# Patient Record
Sex: Female | Born: 1965 | Race: Black or African American | Hispanic: No | Marital: Married | Smoking: Never smoker
Health system: Southern US, Community
[De-identification: ages and names within clinical notes are randomized; demographics above are authoritative.]

## PROBLEM LIST (undated history)

## (undated) ENCOUNTER — Emergency Department (HOSPITAL_COMMUNITY): Discharge status: Absent without leave | Payer: BC Managed Care – PPO

## (undated) DIAGNOSIS — J45909 Unspecified asthma, uncomplicated: Secondary | ICD-10-CM

## (undated) DIAGNOSIS — G971 Other reaction to spinal and lumbar puncture: Secondary | ICD-10-CM

## (undated) DIAGNOSIS — G43909 Migraine, unspecified, not intractable, without status migrainosus: Secondary | ICD-10-CM

## (undated) DIAGNOSIS — S82899A Other fracture of unspecified lower leg, initial encounter for closed fracture: Secondary | ICD-10-CM

## (undated) HISTORY — PX: ABDOMINAL HYSTERECTOMY: SHX81

## (undated) HISTORY — PX: MYOMECTOMY: SHX85

## (undated) HISTORY — PX: ANKLE FRACTURE SURGERY: SHX122

---

## 2003-01-17 ENCOUNTER — Inpatient Hospital Stay (HOSPITAL_COMMUNITY): Admission: AD | Admit: 2003-01-17 | Discharge: 2003-01-17 | Payer: Self-pay | Admitting: Obstetrics and Gynecology

## 2003-03-01 ENCOUNTER — Encounter: Payer: Self-pay | Admitting: Obstetrics and Gynecology

## 2003-03-01 ENCOUNTER — Ambulatory Visit (HOSPITAL_COMMUNITY): Admission: RE | Admit: 2003-03-01 | Discharge: 2003-03-01 | Payer: Self-pay | Admitting: Obstetrics and Gynecology

## 2003-03-19 ENCOUNTER — Encounter: Payer: Self-pay | Admitting: Obstetrics and Gynecology

## 2003-03-19 ENCOUNTER — Observation Stay (HOSPITAL_COMMUNITY): Admission: AD | Admit: 2003-03-19 | Discharge: 2003-03-20 | Payer: Self-pay | Admitting: Obstetrics and Gynecology

## 2003-03-26 ENCOUNTER — Inpatient Hospital Stay (HOSPITAL_COMMUNITY): Admission: AD | Admit: 2003-03-26 | Discharge: 2003-03-27 | Payer: Self-pay | Admitting: Obstetrics and Gynecology

## 2003-03-28 ENCOUNTER — Inpatient Hospital Stay (HOSPITAL_COMMUNITY): Admission: AD | Admit: 2003-03-28 | Discharge: 2003-04-01 | Payer: Self-pay | Admitting: Obstetrics and Gynecology

## 2003-03-29 ENCOUNTER — Encounter: Payer: Self-pay | Admitting: Obstetrics and Gynecology

## 2003-03-29 ENCOUNTER — Encounter (INDEPENDENT_AMBULATORY_CARE_PROVIDER_SITE_OTHER): Payer: Self-pay | Admitting: Specialist

## 2003-04-02 ENCOUNTER — Encounter: Admission: RE | Admit: 2003-04-02 | Discharge: 2003-05-02 | Payer: Self-pay | Admitting: Obstetrics and Gynecology

## 2004-11-26 ENCOUNTER — Inpatient Hospital Stay (HOSPITAL_COMMUNITY): Admission: AD | Admit: 2004-11-26 | Discharge: 2004-11-27 | Payer: Self-pay | Admitting: Obstetrics and Gynecology

## 2004-12-19 ENCOUNTER — Other Ambulatory Visit: Admission: RE | Admit: 2004-12-19 | Discharge: 2004-12-19 | Payer: Self-pay | Admitting: Obstetrics and Gynecology

## 2005-01-24 ENCOUNTER — Encounter: Admission: RE | Admit: 2005-01-24 | Discharge: 2005-01-24 | Payer: Self-pay | Admitting: Obstetrics and Gynecology

## 2005-12-23 ENCOUNTER — Other Ambulatory Visit: Admission: RE | Admit: 2005-12-23 | Discharge: 2005-12-23 | Payer: Self-pay | Admitting: Obstetrics and Gynecology

## 2007-06-11 ENCOUNTER — Encounter: Admission: RE | Admit: 2007-06-11 | Discharge: 2007-06-11 | Payer: Self-pay | Admitting: Specialist

## 2008-03-22 ENCOUNTER — Encounter: Admission: RE | Admit: 2008-03-22 | Discharge: 2008-03-22 | Payer: Self-pay | Admitting: Obstetrics and Gynecology

## 2009-05-09 ENCOUNTER — Encounter: Admission: RE | Admit: 2009-05-09 | Discharge: 2009-05-09 | Payer: Self-pay | Admitting: Obstetrics and Gynecology

## 2010-05-10 ENCOUNTER — Encounter: Admission: RE | Admit: 2010-05-10 | Discharge: 2010-05-10 | Payer: Self-pay | Admitting: Internal Medicine

## 2010-08-21 ENCOUNTER — Emergency Department (HOSPITAL_COMMUNITY): Admission: EM | Admit: 2010-08-21 | Discharge: 2010-08-21 | Payer: Self-pay | Admitting: Family Medicine

## 2011-05-16 NOTE — Op Note (Signed)
[name], [name] NO.:  [id number]   MEDICAL RECORD NO.:  [id number]                   PATIENT TYPE:  INP   LOCATION:  9142                                 FACILITY:  [facility]   PHYSICIAN:  Janine Limbo, M.D.            DATE OF BIRTH:  July 07, 1966   DATE OF PROCEDURE:  03/29/2003  DATE OF DISCHARGE:                                 OPERATIVE REPORT   PREOPERATIVE DIAGNOSES:  1. Gestation at 35 weeks.  2. Third trimester bleeding.  3. Low lying placenta.   POSTOPERATIVE DIAGNOSES:  1. Gestation at 35 weeks.  2. Third trimester bleeding.  3. Low lying placenta.   PROCEDURE:  Amniocentesis, low maturity studies.   SURGEON:  Janine Limbo, M.D.   ANESTHESIA:  1% Xylocaine.   INDICATIONS:  The patient is a 45 year old female, gravida 4, para 1-0-2-1  who presents at 23 weeks' gestation with third trimester bleeding that is  significant and a known low lying placenta.  An amniocentesis was attempted  on the antenatal unit, but we were unable to obtain fluid.  We presented  then to the radiology suite for an attempt at amniocentesis under radiologic  guidance by the radiology team.  The patient understands the indications of  her procedure, and she accepts the risks.   FINDINGS:  Again, the patient was noted to have a single intrauterine  gestation and cephalic presentation.  The placenta was anterior and low  lying.  The amniotic fluid volume was normal.  Again, the patient's blood  type is B positive.  At this time, we were able to obtain 5 mL of clear  fluid.   DESCRIPTION OF PROCEDURE:  The patient was taken to the radiology suite  where again an ultrasound was performed.  An appropriate fluid pocket was  isolated.  The patient's abdomen was prepped with Betadine and then  sterilely draped.  Local Xylocaine was used for anesthetic.  The spinal  needle was inserted, and then under the ultrasound guidance from the  radiology team, we  tried to guide the needle into the pocket.  Initially, we  were unsuccessful.  A second site was identified, and again the area was  numbed using 1% Xylocaine.  The spinal needle was inserted, and this time we  were able to obtain 5 mL of clear fluid.  The fluid was sent to [facility] for a LS ratio and for a PG determination.  The patient  tolerated her procedure well.  Repeat ultrasound shows a normal-appearing  infant.  The patient was taken back to the antenatal unit where her  nonstress was noted to be reactive.  Janine Limbo, M.D.   AVS/MEDQ  D:  03/29/2003  T:  03/29/2003  Job:  [id number]
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
## 2011-05-16 NOTE — Op Note (Signed)
[name], HOSKIE NO.:  [id number]   MEDICAL RECORD NO.:  [id number]                   PATIENT TYPE:  INP   LOCATION:  9142                                 [facility]   PHYSICIAN:  Janine Limbo, M.D.            DATE OF BIRTH:  1966/12/13   DATE OF PROCEDURE:  03/29/2003  DATE OF DISCHARGE:                                 OPERATIVE REPORT   PREOPERATIVE DIAGNOSES:  1. Thirty-five weeks' gestation.  2. Third trimester bleeding.  3. Low lying placenta.   POSTOPERATIVE DIAGNOSES:  1. Thirty-five weeks' gestation.  2. Third trimester bleeding.  3. Low lying placenta.   PROCEDURE:  Amniocentesis for maturity studies - unsuccessful.   OBSTETRICIAN:  Janine Limbo, M.D.   ANESTHESIA:  Local 1% Xylocaine.   DISPOSITION:  The patient is a 45 year old female, gravida 4, para 1-0-2-1,  who presents at 62 weeks' gestation.  She has a known low lying placenta and  she was admitted on March 28, 2003, because of recurrent episodes of third  trimester bleeding.  Her bleeding has been significant.  A speculum exam was  performed and she was noted to have a positive Nitrazine test that was  believed to be due to blood.  Her fern test was negative.  Because of the  patient's significant bleeding, we discussed the merit of amniocentesis.  The risks and benefits were reviewed.  The patient elected to proceed.   FINDINGS:  An ultrasound was performed by Janine Limbo, M.D. in the  antenatal area and the patient was found to have a single intrauterine  gestation.  The infant was in a cephalic presentation.  The placenta was  anterior and grade 2-3.  The placenta was indeed low lying.  The amniotic  fluid volume appeared normal.  The fetal heart motions appeared to be  normal.  The patient's blood type is noted to be B-positive.  Several  attempts were made to obtain fluid but we were unable to do so.   PROCEDURE:  The patient was placed  in a supine position.  An ultrasound was  performed by Janine Limbo, M.D.  An appropriate fluid pocket was  isolated.  The patient's abdomen was prepped with multiple layers of  Betadine and then sterilely draped.  Xylocaine 1% was injected into the  skin.  The spinal needle was inserted into the amniotic cavity.  Initially a  small amount of fluid (less than 1 mL) was obtained and then no other fluid  could be obtained.  We tried to position the needle in the small area but no  fluid again could be obtained.  The ultrasound was repeated and a second  attempt was made in a second place to obtain fluid.  Once again, we were  unsuccessful.  We tried to guide the fluid into the amniotic cavity using  the ultrasound on the  antenatal area but we were unsuccessful.  The decision was made to proceed with repeat amniocentesis in the radiology  area.  The needle was removed.  Repeat ultrasound was performed and there  was no apparent harm to the gestation.  The patient was placed on monitor  and her nonstress test was reactive.  The estimated blood loss for the  procedure was 2 mL.                                               Janine Limbo, M.D.    AVS/MEDQ  D:  03/29/2003  T:  03/29/2003  Job:  [id number]

## 2011-05-16 NOTE — Op Note (Signed)
[name], [name] NO.:  [id number]   MEDICAL RECORD NO.:  [id number]                   PATIENT TYPE:  INP   LOCATION:  9142                                 [facility]   PHYSICIAN:  Janine Limbo, M.D.            DATE OF BIRTH:  April 15, 1966   DATE OF PROCEDURE:  03/29/2003  DATE OF DISCHARGE:                                 OPERATIVE REPORT   PREOPERATIVE DIAGNOSES:  1. Thirty-five weeks' gestation.  2. Prior myomectomy.  3. Low-lying placenta.  4. Fibroid uterus.  5. Active labor.   POSTOPERATIVE DIAGNOSES:  1. Thirty-five weeks' gestation.  2. Prior myomectomy.  3. Low-lying placenta.  4. Fibroid uterus.  5. Active labor.  6. Placental abruption.   PROCEDURE:  Primary low transverse cesarean section.   SURGEON:  Janine Limbo, M.D.   ASSISTANT:  Rica Koyanagi, C.N.M.   ANESTHESIA:  Spinal.   DISPOSITION:  The patient is a 45 year old female, gravida 4, para 1-0-2-1,  who has a known low-lying placenta and who was admitted on 03/28/03 with  third trimester bleeding.  An amniocentesis was performed today to document  fetal maturity.  The test results returned immature.  The patient began to  have hard, frequent contractions.  Speculum exam showed that the cervix had  changed from being long and closed to then being 4 cm dilated.  The patient  was in active labor.  The patient has had a prior myomectomy, and she has  been told to have a cesarean section for the delivery of her infant.  We  discussed the risks associated with cesarean section, which included, but  not limited to, anesthetic complications, bleeding, infection, and possible  damage to surrounding organs.   FINDINGS:  A 5 pound 2 ounce female infant [name]) was delivered from the  cephalic presentation.  The Apgars were 8 at one minute and 9 at five  minutes.  The fallopian tubes and ovaries were normal except for adhesions  between the right ovary and  the right posterior uterus.  The patient was  noted to have an anterior placenta.  There was also an abruption at the  lower placental segment.  The patient was noted to have a 2 cm fibroid on  the left posterior fundus of the uterus.  She had a 4-5 cm fibroid on the  right posterior fundus.   DESCRIPTION OF PROCEDURE:  The patient was taken to the operating room,  where a spinal anesthetic was given.  The patient's abdomen, perineum, and  outer vagina were prepped with multiple layers of Betadine.  A Foley  catheter was placed in the bladder.  The patient was sterilely draped.  A  low transverse incision was made in the abdomen and carried sharply through  the subcutaneous tissue, the fascia, and the anterior peritoneum.  An  incision was made on the lower uterine segment  and extended transversely.  Old blood clot was noted.  The placenta was incised so that we could get to  the infant.  The fetal head was delivered without difficulty.  The mouth and  nose were suctioned.  The remainder of the infant was then delivered.  The  cord was clamped and cut and the infant was handed to the awaiting pediatric  team.  Routine cord blood studies were obtained.  The placenta was removed.  The uterine cavity was then cleaned of amniotic fluid, clotted blood, and  membranes.  A banjo curette was used to curette the inner lining of the  uterus.  The uterine incision was closed using a running locking suture of 2-  0 Vicryl, followed by an imbricating suture of 2-0 Vicryl.  Figure-of-eight  sutures of 2-0 Vicryl were used for hemostasis.  Bleeding was brisk from the  lower uterine segment.  The pericolonic gutters were cleaned of amniotic  fluid and clotted blood.  The pelvis was vigorously irrigated.  Hemostasis  was confirmed.  The anterior peritoneum and the abdominal musculature were  reapproximated in the midline using 2-0 Vicryl.  The fascia and the  subcutaneous layer were irrigated.  Hemostasis  was adequate.  The fascia was  closed using a running suture of 0 Vicryl, followed by three interrupted  sutures of 0 Vicryl.  A Jackson-Pratt drain was placed in the subcutaneous  space and brought out through the left lower quadrant.  The drain was  sutured in place using 3-0 silk.  The subcutaneous layer was closed using  interrupted sutures of 2-0 Vicryl.  The skin was reapproximated using skin  staples.  The sponge, needle, and instrument counts were correct on two  occasions.  The estimated blood loss was 1200 mL.  The patient tolerated her  procedure well.  The patient was taken to the recovery room in stable  condition.  The infant was taken to the full-term nursery in stable  condition.                                               Janine Limbo, M.D.    AVS/MEDQ  D:  03/29/2003  T:  03/30/2003  Job:  [id number]
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
## 2011-05-16 NOTE — Discharge Summary (Signed)
NAMECHENAY, [name] NO.:  [id number]   MEDICAL RECORD NO.:  [id number]                   PATIENT TYPE:  INP   LOCATION:  9142                                 [facility]ILITY:  [facility]   PHYSICIAN:  [name], C.N.M.        DATE OF BIRTH:  26-Aug-1966   DATE OF ADMISSION:  03/28/2003  DATE OF DISCHARGE:  04/01/2003                                 DISCHARGE SUMMARY   ADMISSION DIAGNOSES:  1. Intrauterine pregnancy at [redacted] weeks gestation.  2. Third trimester bleeding.  3. Low lying placenta.   DISCHARGE DIAGNOSES:  1. Intrauterine pregnancy at [redacted] weeks gestation.  2. Third trimester bleeding.  3. Low lying placenta.  4. Early labor.  5. History of severe shoulder dystocia.  6. Desiring a primary cesarean section for delivery with this pregnancy.  7. Immature fetal lung studies by amniocentesis.  8. Placental abruption.  9. Status post myomectomy.  10.      Breast feeding.  11.      Undecided regarding contraception.   PROCEDURE:  1. Amniocentesis for fetal lung maturity studies.  2. Primary low transverse cesarean section for delivery of a viable female     infant named [name] who weighed 5 pounds, 2 ounces and had Apgar's of 8     and 9 on 03/29/03, attended by Dr. Kirkland [name] and [name],     C.N.M.   HOSPITAL COURSE:  [name] is a 45 year old black female gravida 4, para 1,  0, 2, 1 at 34-6/7 weeks who presented complaining of a large amount of  bright red bleeding and clot.  She was admitted initially for observation,  however, continued to bleed and was recommended to proceed with an  amniocentesis for fetal lung maturity studies to decide regarding delivery.  Her fetal lung maturity studies by amniocentesis were negative and tocolysis  was recommended to await further fetal lung maturity.  However she continued to contract and was found to be 4 cm dilated at 6:20  p.m. on 3/31 and was then recommended to proceed with  cesarean section for  delivery secondary to history of severe shoulder dystocia and history of  prior myomectomy and low lying placenta with bleeding.  She underwent the  same and was delivered of a viable female infant named [name] who weighed 5  pounds, 2 ounces and had Apgar's of 8 and 9 on 03/29/03, attended by Dr.  Kirkland [name] and [name], C.N.M.  She was noted to have  placental abruption upon delivery.  Please see operative note for further  details.  Her postoperative course has been essentially uneventful.  She is  ambulating, voiding, and eating without difficulty.  She did have a  significant headache on postoperative day number two but that has resolved  after some Fioricet and Motrin, and she is tolerating regular activities,  and breast feeding is going well.  She is deemed ready  for discharge today.   DISCHARGE INSTRUCTIONS:  As per the [facility] OB/GYN hand-out.   DISCHARGE MEDICATIONS:  Motrin 600 mg p.o. q.6h p.r.n. for pain, Tylox 1-2  p.o. q.4-6h p.r.n. for pain, Feosol daily, prenatal vitamins daily.   DISCHARGE LABS:  Her hemoglobin is 7.8, WBC count at 11,700 and her  platelets at 281,000.  Her discharge follow up will be in six weeks at [facility] or  p.r.n.                                               [name], C.N.M.    SJD/MEDQ  D:  04/01/2003  T:  04/02/2003  Job:  [id number]
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
## 2011-05-16 NOTE — Discharge Summary (Signed)
   [name], [name] NO.:  [id number]   MEDICAL RECORD NO.:  [id number]                   PATIENT TYPE:  INP   LOCATION:  9149                                 [facility]ILITY[facility]   PHYSICIAN:  Hal Morales, M.D.             DATE OF BIRTH:  23-Jan-1966   DATE OF ADMISSION:  03/19/2003  DATE OF DISCHARGE:  03/20/2003                                 DISCHARGE SUMMARY   ADMISSION DIAGNOSES:  1. Intrauterine pregnancy at 12 and 4/7 weeks.  2. Low lying placenta.  3. Vaginal bleeding.   DISCHARGE DIAGNOSES:  1. Intrauterine pregnancy at 12 and 4/7 weeks.  2. Low lying placenta.  3. Vaginal bleeding.  4. Resolved preterm uterine contractions.  5. Resolved third trimester bleeding.   PROCEDURE:  None.   HOSPITAL COURSE:  The patient is a 45 year old gravida 4, para 1-0-2-1 at 29  and 4/7 weeks who presented complaining of an episode of bright red  bleeding.  No contractions or cramping noted at that time.  She was noted to  have a large clot of blood in the vagina that was evacuated without further  active bleeding and was admitted for further observation.  She had an  ultrasound that showed a low lying placenta 1.0 cm from internal os.  Had  been previously 9 mm from internal os on March 3.  Her AFI was 10.2.  Estimated fetal weight was 25th-50th percentile.  Cervix was 3.1 cm long and  the fetus was cephalic in presentation.  She has been observed for the last  24 hours without further bleeding, without regular uterine contractions, and  is deemed okay for discharge today continuing with pelvic rest and out of  work for one week.  She has a follow-up appointment on March 24, 2003 at  [facility].   DISCHARGE INSTRUCTIONS:  She is to call for any reoccurrence of bleeding,  cramping, or other problems.  She is to follow up on March 24, 2003 at  [facility] OB[facility] or p.r.n.  She is to be out of work for one week and  continue on  pelvic rest throughout the rest of this pregnancy.   DISCHARGE LABORATORIES:  Her hemoglobin is 11.0.  Her hematocrit is 33.1.  WBC count is 7.1.  Her platelets are 287,000.     Concha Pyo. Duplantis, C.N.M.              Hal Morales, M.D.    SJD/MEDQ  D:  03/20/2003  T:  03/20/2003  Job:  [id number]

## 2012-03-16 ENCOUNTER — Ambulatory Visit (INDEPENDENT_AMBULATORY_CARE_PROVIDER_SITE_OTHER): Payer: BC Managed Care – PPO | Admitting: Obstetrics and Gynecology

## 2012-03-16 ENCOUNTER — Ambulatory Visit: Payer: Self-pay | Admitting: Obstetrics and Gynecology

## 2012-03-16 DIAGNOSIS — N920 Excessive and frequent menstruation with regular cycle: Secondary | ICD-10-CM

## 2012-03-16 DIAGNOSIS — Z01419 Encounter for gynecological examination (general) (routine) without abnormal findings: Secondary | ICD-10-CM

## 2012-04-13 ENCOUNTER — Ambulatory Visit (INDEPENDENT_AMBULATORY_CARE_PROVIDER_SITE_OTHER): Payer: BC Managed Care – PPO | Admitting: Obstetrics and Gynecology

## 2012-04-13 ENCOUNTER — Encounter: Payer: Self-pay | Admitting: Obstetrics and Gynecology

## 2012-04-13 VITALS — BP 122/60 | HR 79 | Wt 209.0 lb

## 2012-04-13 DIAGNOSIS — N920 Excessive and frequent menstruation with regular cycle: Secondary | ICD-10-CM

## 2012-04-13 DIAGNOSIS — G43909 Migraine, unspecified, not intractable, without status migrainosus: Secondary | ICD-10-CM | POA: Insufficient documentation

## 2012-04-13 DIAGNOSIS — N923 Ovulation bleeding: Secondary | ICD-10-CM

## 2012-04-13 DIAGNOSIS — N921 Excessive and frequent menstruation with irregular cycle: Secondary | ICD-10-CM

## 2012-04-13 NOTE — Patient Instructions (Signed)
Endometrial Biopsy This is a test in which a tissue sample (a biopsy) is taken from inside the uterus (womb). It is then looked at by a specialist under a microscope to see if the tissue is normal or abnormal. The endometrium is the lining of the uterus. This test helps determine where you are in your menstrual cycle and how hormone levels are affecting the lining of the uterus. Another use for this test is to diagnose endometrial cancer, tuberculosis, polyps, or inflammatory conditions and to evaluate uterine bleeding. PREPARATION FOR TEST No preparation or fasting is necessary. NORMAL FINDINGS No pathologic conditions. Presence of "secretory-type" endometrium 3 to 5 days before to normal menstruation. Ranges for normal findings may vary among different laboratories and hospitals. You should always check with your doctor after having lab work or other tests done to discuss the meaning of your test results and whether your values are considered within normal limits. MEANING OF TEST  Your caregiver will go over the test results with you and discuss the importance and meaning of your results, as well as treatment options and the need for additional tests if necessary. OBTAINING THE TEST RESULTS It is your responsibility to obtain your test results. Ask the lab or department performing the test when and how you will get your results. Document Released: 04/17/2005 Document Revised: 12/04/2011 Document Reviewed: 11/24/2008 Exit[employer] Patient Information 2012 [employer]. 

## 2012-04-13 NOTE — Progress Notes (Signed)
ENDOMETRIAL BIOPSY   INDICATION: intermenstrual bleeding and MENORRHAGIA  LMP: 04/05/12 UPT: DECLINED     Cervix prepped with betadine Tenaculum applied to anterior lip of the cervix: no Uterus sounded at  8  cm Endometrial biopsy easily performed with Pipelle Well tolerated  Specimen appropriately identified and sent to pathology  Follow-up: Will call pt with results and for followup   Nickayla Mcinnis P MD 04/13/2012 2:48 PM

## 2012-05-03 ENCOUNTER — Telehealth: Payer: Self-pay | Admitting: Obstetrics and Gynecology

## 2012-05-03 NOTE — Telephone Encounter (Signed)
Pt given normal endometrial biopsy results and reminded of Hgb done at visit of 11.9 Options for addresssing mennorrhagia: 1) Obs only       2)Mirena       3) Endometrial Ablation Pt wants in office ablation.  Needs SHG.  She will call with the onset of her next menses to have SHG scheduled.

## 2012-06-29 ENCOUNTER — Telehealth: Payer: Self-pay | Admitting: Obstetrics and Gynecology

## 2012-06-29 NOTE — Telephone Encounter (Signed)
Chandra/vph pt °

## 2012-06-29 NOTE — Telephone Encounter (Signed)
BARBARA WILL SCHED

## 2012-07-12 ENCOUNTER — Encounter: Payer: Self-pay | Admitting: Obstetrics and Gynecology

## 2012-07-12 ENCOUNTER — Ambulatory Visit (INDEPENDENT_AMBULATORY_CARE_PROVIDER_SITE_OTHER): Payer: BC Managed Care – PPO

## 2012-07-12 ENCOUNTER — Other Ambulatory Visit: Payer: Self-pay | Admitting: Obstetrics and Gynecology

## 2012-07-12 ENCOUNTER — Ambulatory Visit (INDEPENDENT_AMBULATORY_CARE_PROVIDER_SITE_OTHER): Payer: BC Managed Care – PPO | Admitting: Obstetrics and Gynecology

## 2012-07-12 ENCOUNTER — Other Ambulatory Visit: Payer: Self-pay

## 2012-07-12 VITALS — BP 118/70 | Ht 67.0 in | Wt 205.5 lb

## 2012-07-12 DIAGNOSIS — IMO0002 Reserved for concepts with insufficient information to code with codable children: Secondary | ICD-10-CM

## 2012-07-12 DIAGNOSIS — N920 Excessive and frequent menstruation with regular cycle: Secondary | ICD-10-CM

## 2012-07-12 DIAGNOSIS — Z0000 Encounter for general adult medical examination without abnormal findings: Secondary | ICD-10-CM

## 2012-07-12 LAB — POCT URINE PREGNANCY: Preg Test, Ur: NEGATIVE

## 2012-07-12 NOTE — Addendum Note (Signed)
Addended by: Lerry Liner D on: 07/12/2012 03:53 PM   Modules accepted: Orders

## 2012-07-12 NOTE — Progress Notes (Signed)
Menorrhagia and irregular menses Last menstrual period began on June 28, 2012 and lasted for 10 days. Previous menstrual period had been in April of 2013. She denies intermenstrual bleeding.  She has significant cramping when her menses began which usually respond to ibuprofen. The ibuprofen also seems to help to a minor extent with the very heavy menses.  Sonohysterogram: Ultrasound: Anteverted uterus. C-section scar noted. Several nabothian cysts on the cervix. Normal ovaries. Normal adnexa. Saline infusion: No focal endometrial lesions are seen.  Examination: BP 118/70  Ht 5\' 7"  (1.702 m)  Wt 205 lb 8 oz (93.214 kg)  BMI 32.19 kg/m2  LMP 06/28/2012  Impression: Menorrhagia Irregular menses with history of polycystic ovarian syndrome. Normal endometrial biopsy.  Recommendation: The patient is a candidate for either a Mirena IUD, Skyler IUD, endometrial ablation, or hysterectomy.  The risks and benefits of each of these procedures is reviewed. She has decided that she would prefer to proceed with endometrial ablation. This will be done as an outpatient in the hospital

## 2012-07-14 ENCOUNTER — Encounter: Payer: BC Managed Care – PPO | Admitting: Obstetrics and Gynecology

## 2012-07-15 ENCOUNTER — Encounter: Payer: BC Managed Care – PPO | Admitting: Obstetrics and Gynecology

## 2012-07-28 ENCOUNTER — Telehealth: Payer: Self-pay | Admitting: Obstetrics and Gynecology

## 2012-07-28 NOTE — Telephone Encounter (Signed)
Endometrial Ablation; Probably Novasure scheduled for 08/24/12 @ 9:30 with [facility].  [employer] effective 06/28/12. Plan pays 70/30 after a $466 Deductible. Pre-op due  $631.41 -Adrianne Pridgen

## 2012-07-30 ENCOUNTER — Telehealth: Payer: Self-pay | Admitting: Obstetrics and Gynecology

## 2012-08-06 ENCOUNTER — Telehealth: Payer: Self-pay | Admitting: Obstetrics and Gynecology

## 2012-08-06 NOTE — Telephone Encounter (Signed)
Pt called to cancel 08/24/2012 surgery/[facility]. States deductible amt increase to $600. Wants to reschedule surgery for September. -acm

## 2012-08-24 ENCOUNTER — Encounter (HOSPITAL_COMMUNITY): Admission: RE | Payer: Self-pay | Source: Ambulatory Visit

## 2012-08-24 ENCOUNTER — Ambulatory Visit (HOSPITAL_COMMUNITY)
Admission: RE | Admit: 2012-08-24 | Payer: BC Managed Care – PPO | Source: Ambulatory Visit | Admitting: Obstetrics and Gynecology

## 2012-08-24 SURGERY — NOVASURE ABLATION
Anesthesia: Choice

## 2012-09-15 ENCOUNTER — Telehealth: Payer: Self-pay | Admitting: Obstetrics and Gynecology

## 2012-10-12 ENCOUNTER — Telehealth: Payer: Self-pay | Admitting: Obstetrics and Gynecology

## 2012-10-18 ENCOUNTER — Other Ambulatory Visit: Payer: Self-pay | Admitting: Internal Medicine

## 2012-10-18 DIAGNOSIS — Z1231 Encounter for screening mammogram for malignant neoplasm of breast: Secondary | ICD-10-CM

## 2012-11-23 ENCOUNTER — Ambulatory Visit
Admission: RE | Admit: 2012-11-23 | Discharge: 2012-11-23 | Disposition: A | Discharge status: Home / Self Care | Payer: BC Managed Care – PPO | Source: Ambulatory Visit | Attending: Internal Medicine | Admitting: Internal Medicine

## 2012-11-23 DIAGNOSIS — Z1231 Encounter for screening mammogram for malignant neoplasm of breast: Secondary | ICD-10-CM

## 2012-12-08 ENCOUNTER — Encounter: Payer: Self-pay | Admitting: Obstetrics and Gynecology

## 2012-12-08 DIAGNOSIS — R922 Inconclusive mammogram: Secondary | ICD-10-CM | POA: Insufficient documentation

## 2012-12-09 ENCOUNTER — Encounter: Payer: Self-pay | Admitting: Obstetrics and Gynecology

## 2014-02-15 ENCOUNTER — Other Ambulatory Visit: Payer: Self-pay

## 2014-02-15 DIAGNOSIS — Z1231 Encounter for screening mammogram for malignant neoplasm of breast: Secondary | ICD-10-CM

## 2014-03-02 ENCOUNTER — Ambulatory Visit
Admission: RE | Admit: 2014-03-02 | Discharge: 2014-03-02 | Disposition: A | Discharge status: Home / Self Care | Payer: Self-pay | Source: Ambulatory Visit

## 2014-03-02 DIAGNOSIS — Z1231 Encounter for screening mammogram for malignant neoplasm of breast: Secondary | ICD-10-CM

## 2015-01-05 ENCOUNTER — Other Ambulatory Visit: Payer: Self-pay | Admitting: Obstetrics and Gynecology

## 2015-03-28 ENCOUNTER — Encounter (HOSPITAL_COMMUNITY): Payer: Self-pay | Admitting: Emergency Medicine

## 2015-03-28 ENCOUNTER — Emergency Department (HOSPITAL_COMMUNITY): Payer: BC Managed Care – PPO

## 2015-03-28 ENCOUNTER — Emergency Department (HOSPITAL_COMMUNITY)
Admission: EM | Admit: 2015-03-28 | Discharge: 2015-03-28 | Disposition: A | Discharge status: Home / Self Care | Payer: BC Managed Care – PPO | Attending: Emergency Medicine | Admitting: Emergency Medicine

## 2015-03-28 DIAGNOSIS — Y998 Other external cause status: Secondary | ICD-10-CM | POA: Diagnosis not present

## 2015-03-28 DIAGNOSIS — Z79899 Other long term (current) drug therapy: Secondary | ICD-10-CM | POA: Diagnosis not present

## 2015-03-28 DIAGNOSIS — W108XXA Fall (on) (from) other stairs and steps, initial encounter: Secondary | ICD-10-CM | POA: Diagnosis not present

## 2015-03-28 DIAGNOSIS — Y9289 Other specified places as the place of occurrence of the external cause: Secondary | ICD-10-CM | POA: Insufficient documentation

## 2015-03-28 DIAGNOSIS — Y9389 Activity, other specified: Secondary | ICD-10-CM | POA: Diagnosis not present

## 2015-03-28 DIAGNOSIS — J45909 Unspecified asthma, uncomplicated: Secondary | ICD-10-CM | POA: Insufficient documentation

## 2015-03-28 DIAGNOSIS — S82852A Displaced trimalleolar fracture of left lower leg, initial encounter for closed fracture: Secondary | ICD-10-CM | POA: Diagnosis not present

## 2015-03-28 DIAGNOSIS — S9305XA Dislocation of left ankle joint, initial encounter: Secondary | ICD-10-CM | POA: Diagnosis not present

## 2015-03-28 DIAGNOSIS — G43909 Migraine, unspecified, not intractable, without status migrainosus: Secondary | ICD-10-CM | POA: Diagnosis not present

## 2015-03-28 DIAGNOSIS — S99912A Unspecified injury of left ankle, initial encounter: Secondary | ICD-10-CM | POA: Diagnosis present

## 2015-03-28 HISTORY — DX: Unspecified asthma, uncomplicated: J45.909

## 2015-03-28 HISTORY — DX: Migraine, unspecified, not intractable, without status migrainosus: G43.909

## 2015-03-28 MED ORDER — HYDROCODONE-ACETAMINOPHEN 5-325 MG PO TABS: 1.0000 | ORAL_TABLET | Freq: Four times a day (QID) | ORAL | Status: DC | PRN

## 2015-03-28 MED ORDER — HYDROMORPHONE HCL 1 MG/ML IJ SOLN
1.0000 mg | Freq: Once | INTRAMUSCULAR | Status: AC
Start: 2015-03-28 — End: 2015-03-28
  Administered 2015-03-28: 1 mg via INTRAVENOUS
  Filled 2015-03-28: qty 1

## 2015-03-28 MED ORDER — HYDROMORPHONE HCL 1 MG/ML IJ SOLN
1.0000 mg | Freq: Once | INTRAMUSCULAR | Status: AC
  Administered 2015-03-28: 1 mg via INTRAVENOUS
  Filled 2015-03-28: qty 1

## 2015-03-28 MED ORDER — FENTANYL CITRATE 0.05 MG/ML IJ SOLN
50.0000 ug | Freq: Once | INTRAMUSCULAR | Status: AC
  Administered 2015-03-28: 50 ug via INTRAVENOUS
  Filled 2015-03-28: qty 2

## 2015-03-28 MED ORDER — ONDANSETRON 4 MG PO TBDP
4.0000 mg | ORAL_TABLET | Freq: Once | ORAL | Status: AC
  Administered 2015-03-28: 4 mg via ORAL
  Filled 2015-03-28: qty 1

## 2015-03-28 MED ORDER — HYDROMORPHONE HCL 2 MG/ML IJ SOLN: 2.0000 mg | Freq: Once | INTRAMUSCULAR | Status: DC

## 2015-03-28 NOTE — Discharge Instructions (Signed)
Ankle Fracture A fracture is a break in a bone. The ankle joint is made up of three bones. These include the lower (distal)sections of your lower leg bones, called the tibia and fibula, along with a bone in your foot, called the talus. Depending on how bad the break is and if more than one ankle joint bone is broken, a cast or splint is used to protect and keep your injured bone from moving while it heals. Sometimes, surgery is required to help the fracture heal properly.  There are two general types of fractures:  Stable fracture. This includes a single fracture line through one bone, with no injury to ankle ligaments. A fracture of the talus that does not have any displacement (movement of the bone on either side of the fracture line) is also stable.  Unstable fracture. This includes more than one fracture line through one or more bones in the ankle joint. It also includes fractures that have displacement of the bone on either side of the fracture line. CAUSES  A direct blow to the ankle.   Quickly and severely twisting your ankle.  Trauma, such as a car accident or falling from a significant height. RISK FACTORS You may be at a higher risk of ankle fracture if:  You have certain medical conditions.  You are involved in high-impact sports.  You are involved in a high-impact car accident. SIGNS AND SYMPTOMS   Tender and swollen ankle.  Bruising around the injured ankle.  Pain on movement of the ankle.  Difficulty walking or putting weight on the ankle.  A cold foot below the site of the ankle injury. This can occur if the blood vessels passing through your injured ankle were also damaged.  Numbness in the foot below the site of the ankle injury. DIAGNOSIS  An ankle fracture is usually diagnosed with a physical exam and X-rays. A CT scan may also be required for complex fractures. TREATMENT  Stable fractures are treated with a cast or splint and using crutches to avoid putting  weight on your injured ankle. This is followed by an ankle strengthening program. Some patients require a special type of cast, depending on other medical problems they may have. Unstable fractures require surgery to ensure the bones heal properly. Your health care provider will tell you what type of fracture you have and the best treatment for your condition. HOME CARE INSTRUCTIONS   Review correct crutch use with your health care provider and use your crutches as directed. Safe use of crutches is extremely important. Misuse of crutches can cause you to fall or cause injury to nerves in your hands or armpits.  Do not put weight or pressure on the injured ankle until directed by your health care provider.  To lessen the swelling, keep the injured leg elevated while sitting or lying down.  Apply ice to the injured area:  Put ice in a plastic bag.  Place a towel between your cast and the bag.  Leave the ice on for 20 minutes, 2-3 times a day.  If you have a plaster or fiberglass cast:  Do not try to scratch the skin under the cast with any objects. This can increase your risk of skin infection.  Check the skin around the cast every day. You may put lotion on any red or sore areas.  Keep your cast dry and clean.  If you have a plaster splint:  Wear the splint as directed.  You may loosen the elastic  around the splint if your toes become numb, tingle, or turn cold or blue.  Do not put pressure on any part of your cast or splint; it may break. Rest your cast only on a pillow the first 24 hours until it is fully hardened.  Your cast or splint can be protected during bathing with a plastic bag sealed to your skin with medical tape. Do not lower the cast or splint into water.  Take medicines as directed by your health care provider. Only take over-the-counter or prescription medicines for pain, discomfort, or fever as directed by your health care provider.  Do not drive a vehicle until  your health care provider specifically tells you it is safe to do so.  If your health care provider has given you a follow-up appointment, it is very important to keep that appointment. Not keeping the appointment could result in a chronic or permanent injury, pain, and disability. If you have any problem keeping the appointment, call the facility for assistance. SEEK MEDICAL CARE IF: You develop increased swelling or discomfort. SEEK IMMEDIATE MEDICAL CARE IF:   Your cast gets damaged or breaks.  You have continued severe pain.  You develop new pain or swelling after the cast was put on.  Your skin or toenails below the injury turn blue or gray.  Your skin or toenails below the injury feel cold, numb, or have loss of sensitivity to touch.  There is a bad smell or pus draining from under the cast. MAKE SURE YOU:   Understand these instructions.  Will watch your condition.  Will get help right away if you are not doing well or get worse. Document Released: 12/12/2000 Document Revised: 12/20/2013 Document Reviewed: 07/14/2013 [facility] Patient Information 2015 [place], [place]. This information is not intended to replace advice given to you by your health care provider. Make sure you discuss any questions you have with your health care provider.  Ankle Dislocation Ankle dislocation happens when the ankle bones move out of place. Usually, the injury that causes ankle dislocation also causes other injuries that are more serious. Often these injuries are broken ankle bones. You also may have injured nerves and blood vessels.  Your doctor will put your ankle back in place. Any tears on your skin around your ankle will be closed (this is usually done in surgery). A cast or splint will be placed around your ankle to hold it in place while it heals. Sometimes, screws and plates need to be drilled into your ankle bones to hold your ankle in place. Sometimes, pins are drilled into the bones of your  lower leg and foot. These pins attach to a metal bar outside your body. The pins and the bar hold your bones in place until surgery can be done. HOME CARE  Rest your injured joint. Do not move it.  Put ice on your injured joint for 1 to 2 days or as told by your doctor.  Put ice in a plastic bag.  Place a towel between your skin and the bag.  Leave the ice on for 15 to 20 minutes, every 2 hours while your are awake.  Raise your ankle above your heart as told by your doctor. This helps limit puffiness.  Move your toes as told by your doctor so they do not get stiff.  Only take medicines as told by your doctor. GET HELP RIGHT AWAY IF:  Your cast or splint becomes loose or damaged.  Your screws, plates, or the  metal bar outside your body becomes loose or damaged.  You notice fluid draining around any pins.  Your pain becomes worse, not better.  You lose feeling in your toe or cannot bend the tip of your toe. MAKE SURE YOU:   Understand these instructions.  Will watch your condition.  Will get help right away if you are not doing well or get worse. Document Released: 08/13/2011 Document Revised: 03/08/2012 Document Reviewed: 08/13/2011 [facility] Patient Information 2015 [place], [place]. This information is not intended to replace advice given to you by your health care provider. Make sure you discuss any questions you have with your health care provider.

## 2015-03-28 NOTE — ED Provider Notes (Signed)
CSN: [id number]     Arrival date & time 03/28/15  1359 History   First MD Initiated Contact with Patient 03/28/15 1504     Chief Complaint  Patient presents with  . Ankle Pain     (Consider location/radiation/quality/duration/timing/severity/associated sxs/prior Treatment) Patient is a 49 y.o. female presenting with ankle pain. The history is provided by the patient.  Ankle Pain Location:  Ankle Time since incident:  2 hours Injury: yes   Mechanism of injury: fall   Fall:    Fall occurred:  Down stairs   Impact surface:  Hard floor   Point of impact:  Feet Ankle location:  L ankle Pain details:    Quality:  Shooting, throbbing and pressure   Radiates to:  Does not radiate   Severity:  Severe   Onset quality:  Sudden   Timing:  Constant   Progression:  Unchanged Chronicity:  New Dislocation: yes   Prior injury to area:  No Relieved by:  Nothing Exacerbated by: movement. Ineffective treatments:  None tried Associated symptoms: decreased ROM and swelling   Risk factors: no known bone disorder     Past Medical History  Diagnosis Date  . Asthma   . Migraine    History reviewed. No pertinent past surgical history. History reviewed. No pertinent family history. History  Substance Use Topics  . Smoking status: Never Smoker   . Smokeless tobacco: Never Used  . Alcohol Use: Not on file   OB History    No data available     Review of Systems  Neurological: Negative for headaches.       No LOC  All other systems reviewed and are negative.     Allergies  Ciprofloxacin  Home Medications   Prior to Admission medications   Medication Sig Start Date End Date Taking? Authorizing Provider  albuterol (PROVENTIL HFA;VENTOLIN HFA) 108 (90 BASE) MCG/ACT inhaler Inhale 2 puffs into the lungs every 6 (six) hours as needed for wheezing or shortness of breath.   Yes Historical Provider, MD  DULERA 200-5 MCG/ACT AERO Inhale 2 puffs into the lungs 2 (two) times daily. 03/07/15   Yes Historical Provider, MD  eletriptan (RELPAX) 20 MG tablet One tablet by mouth at onset of headache. May repeat in 2 hours if headache persists or recurs. may repeat in 2 hours if necessary   Yes Historical Provider, MD  fluticasone (FLONASE) 50 MCG/ACT nasal spray Place 2 sprays into the nose daily as needed for allergies.    Yes Historical Provider, MD  levocetirizine (XYZAL) 5 MG tablet Take 5 mg by mouth every evening. 03/07/15  Yes Historical Provider, MD  topiramate (TOPAMAX) 15 MG capsule Take 15 mg by mouth at bedtime.   Yes Historical Provider, MD   BP 114/74 mmHg  Pulse 63  Temp(Src) 97.9 F (36.6 C) (Oral)  Resp 21  SpO2 100% Physical Exam  Constitutional: She is oriented to person, place, and time. She appears well-developed and well-nourished. No distress.  HENT:  Head: Normocephalic and atraumatic.  Pulmonary/Chest: Effort normal.  Musculoskeletal:       Left ankle: She exhibits decreased range of motion, swelling, ecchymosis and deformity. She exhibits normal pulse. Tenderness. Lateral malleolus and medial malleolus tenderness found.  Normal sensation and movement of the toes  Neurological: She is alert and oriented to person, place, and time.  Skin: Skin is warm and dry.  Psychiatric: She has a normal mood and affect. Her behavior is normal.  Nursing note and vitals reviewed.  ED Course  Procedures (including critical care time) Labs Review Labs Reviewed - No data to display  Imaging Review Dg Ankle 2 Views Left  03/28/2015   CLINICAL DATA:  Post reduction fill  EXAM: LEFT ANKLE - 2 VIEW  COMPARISON:  Left ankle films of 03/28/2015 earlier today  FINDINGS: The tibiotalar dislocation has been reduced. There is improved position and alignment of the fractures involving the medial malleolus, posterior malleolus, and obliquely through the distal left fibula.  IMPRESSION: Reduction of tibiotalar dislocation with improved position and alignment of trimalleolar fractures.    Electronically Signed   By: Ivar Drape M.D.   On: 03/28/2015 16:54   Dg Ankle Complete Left  03/28/2015   CLINICAL DATA:  Status post slipping and falling with persistent ankle bruising and swelling  EXAM: LEFT ANKLE COMPLETE - 3+ VIEW  COMPARISON:  None.  FINDINGS: The patient has sustained a trimalleolar fracture dislocation of the left ankle. There is complete disruption of the ankle joint mortise. The distal fibular fracture has a spiral component extending into the distal third of the diaphysis. The talar dome is intact. The calcaneus is intact. There are small plantar and Achilles region calcaneal spurs.  IMPRESSION: There is a comminuted trimalleolar fracture dislocation of the left ankle.   Electronically Signed   By: David  Martinique   On: 03/28/2015 14:54     EKG Interpretation None      MDM   Final diagnoses:  None    Patient with a mechanical fall down the steps today who now has a comminuted tried all dislocated ankle fracture. She is neurovascularly intact. No other area of injury. No LOC or head injury. She does not take anticoagulation. Spoke with Fredonia Highland who evaluated her films and requests attempting to reduce and splint. If ankle appears relocated will have her follow-up in the office on Friday.  Patient given pain medicine.  Reduction of dislocation Date/Time: 5:05 PM Performed by: Blanchie Dessert Authorized byBlanchie Dessert Consent: Verbal consent obtained. Risks and benefits: risks, benefits and alternatives were discussed Consent given by: patient Required items: required blood products, implants, devices, and special equipment available Time out: Immediately prior to procedure a "time out" was called to verify the correct patient, procedure, equipment, support staff and site/side marked as required.  Patient sedated: no Patient tolerance: Patient tolerated the procedure well with no immediate complications. Joint:left trimalleolar fracture and  dislocation  Reduction technique: Patient was allowed to hang in suspension while holding her toes in a flexed position against body weight with reduction of dislocation and then splinted in foot inversion   5:06 PM Imaging showed reduction of the tibiotalar dislocation with improved position and alignment of the fractures. Patient was given crutches made nonweightbearing and tolerate her diet and follow up with Dr. Percell Miller on Friday at 8:30  Blanchie Dessert, MD 03/28/15 1706

## 2015-03-28 NOTE — ED Notes (Signed)
MD at bedside. 

## 2015-03-28 NOTE — ED Notes (Addendum)
Pt was walking down some steps and slipped. Pt heard a pop and felt pain in her L ankle. L ankle bruised and swollen. Pt unable to move toes. Good pedal pulse and sensation intact. Pt given total of 231mcg fentanyl with EMS

## 2015-03-28 NOTE — ED Notes (Signed)
Bed: WA03 Expected date:  Expected time:  Means of arrival:  Comments: EMS-ankle pain

## 2015-03-28 NOTE — ED Notes (Signed)
MD and ortho tech at bedside  ?

## 2016-04-17 IMAGING — CR DG ANKLE 2V *L*
2 series · 2 of 2 positions shown · non-contrast
Comparison: Left ankle films of 03/28/2015 earlier today

CLINICAL DATA: Post reduction fill

EXAM:
LEFT ANKLE - 2 VIEW

[x ankle ap left]
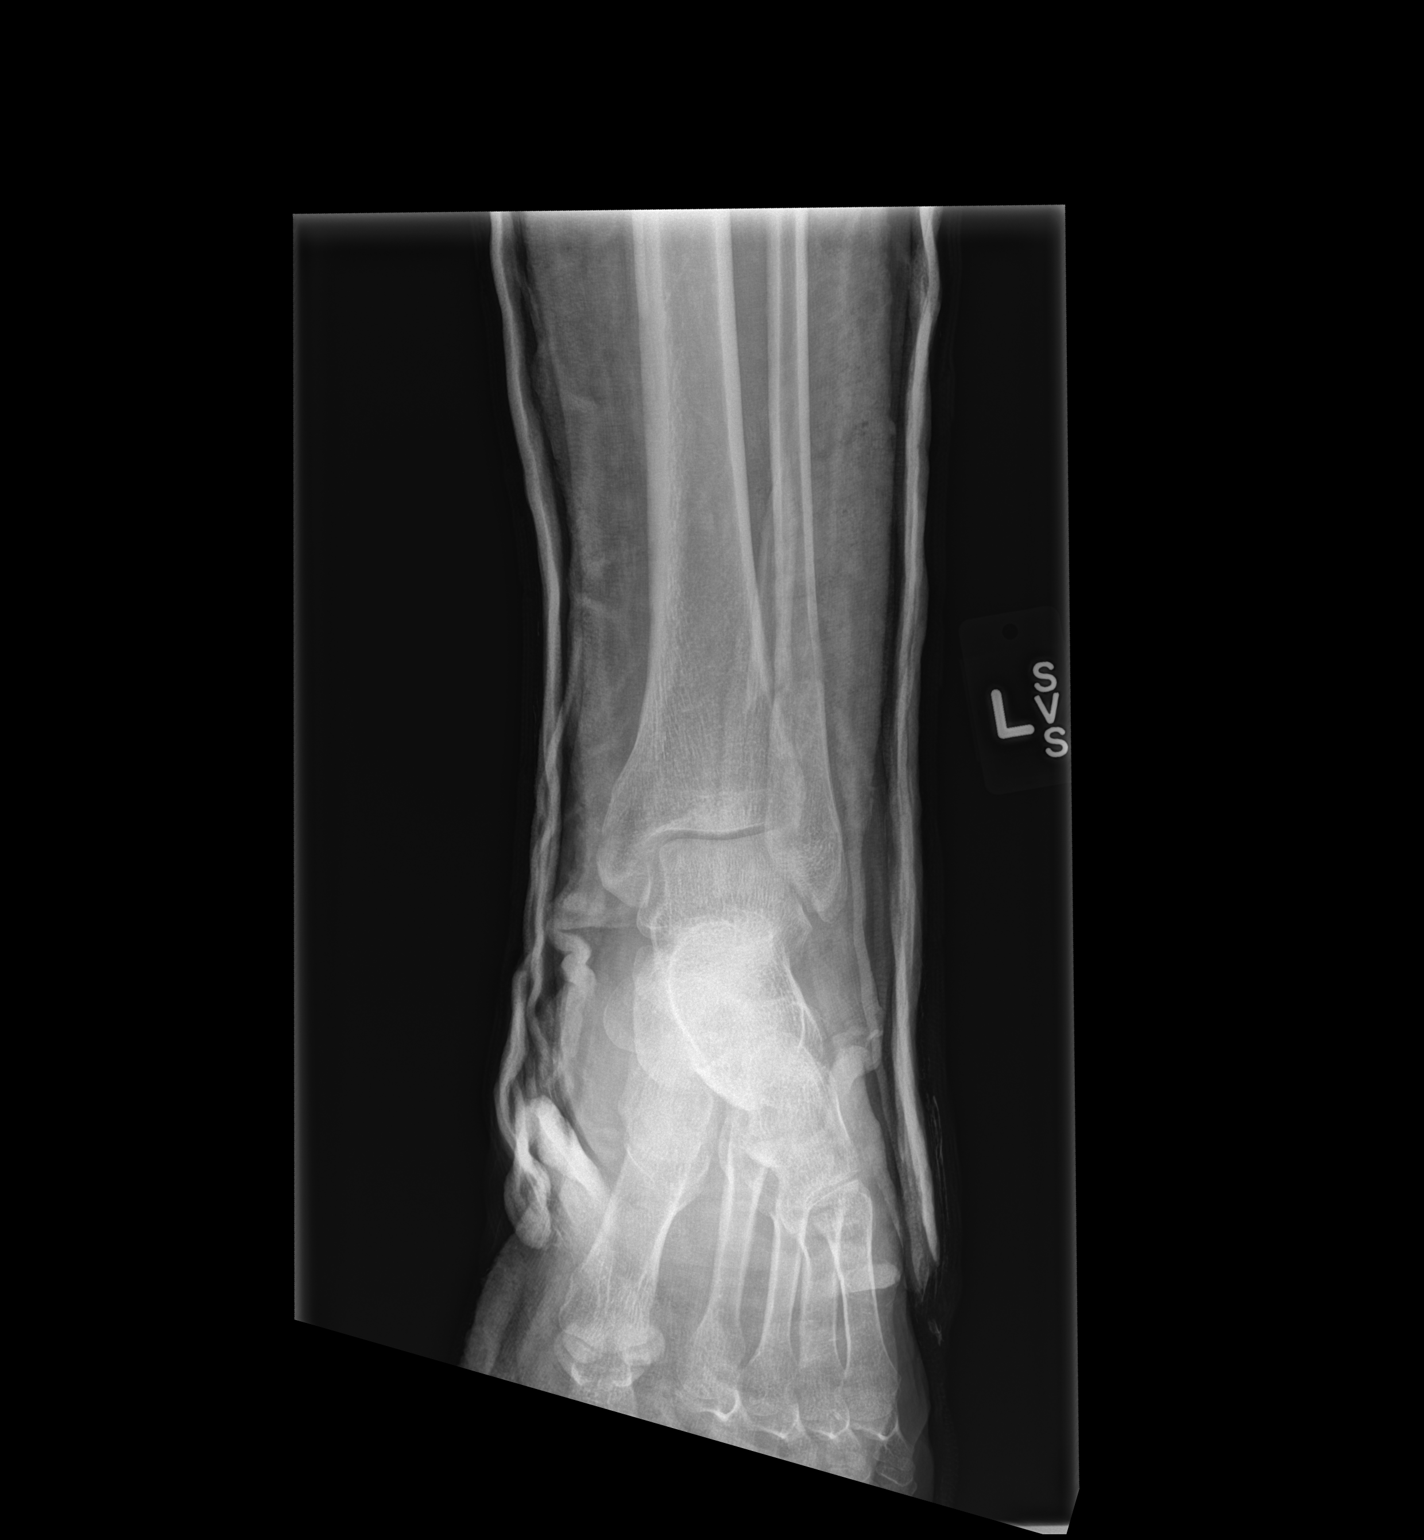

[x ankle lat left]
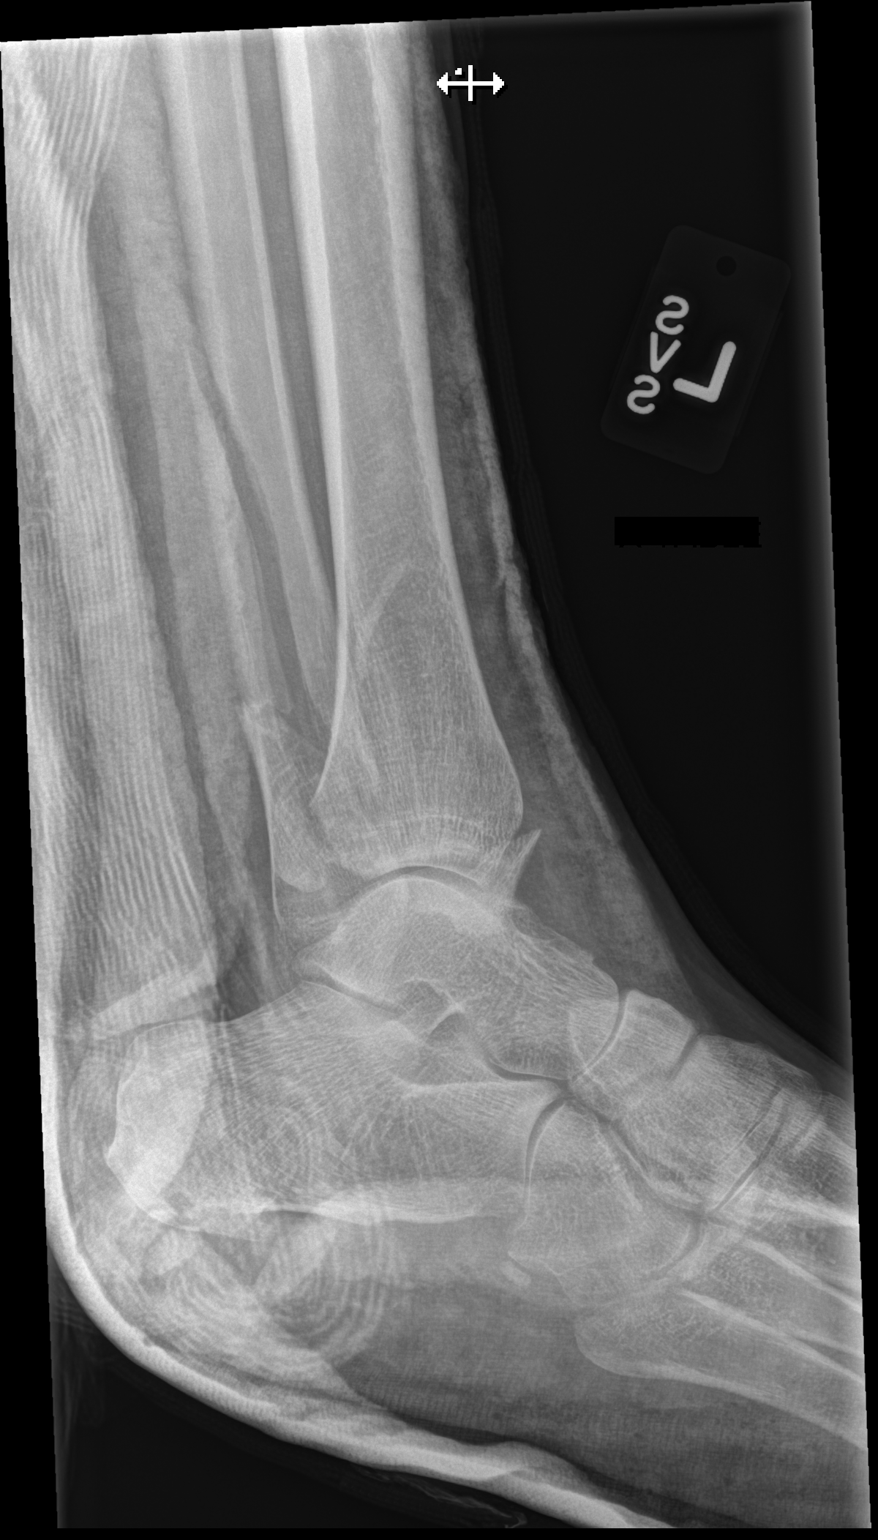

[2 of 2 positions shown; findings below may reference images not displayed]

FINDINGS: The tibiotalar dislocation has been reduced. There is improved
position and alignment of the fractures involving the medial
malleolus, posterior malleolus, and obliquely through the distal
left fibula.
IMPRESSION: Reduction of tibiotalar dislocation with improved position and
alignment of trimalleolar fractures.

## 2016-04-17 IMAGING — CR DG ANKLE COMPLETE 3+V*L*
3 series · 3 of 3 positions shown · non-contrast
Comparison: None.

CLINICAL DATA: Status post slipping and falling with persistent
ankle bruising and swelling

EXAM:
LEFT ANKLE COMPLETE - 3+ VIEW

[x ankle ap left]
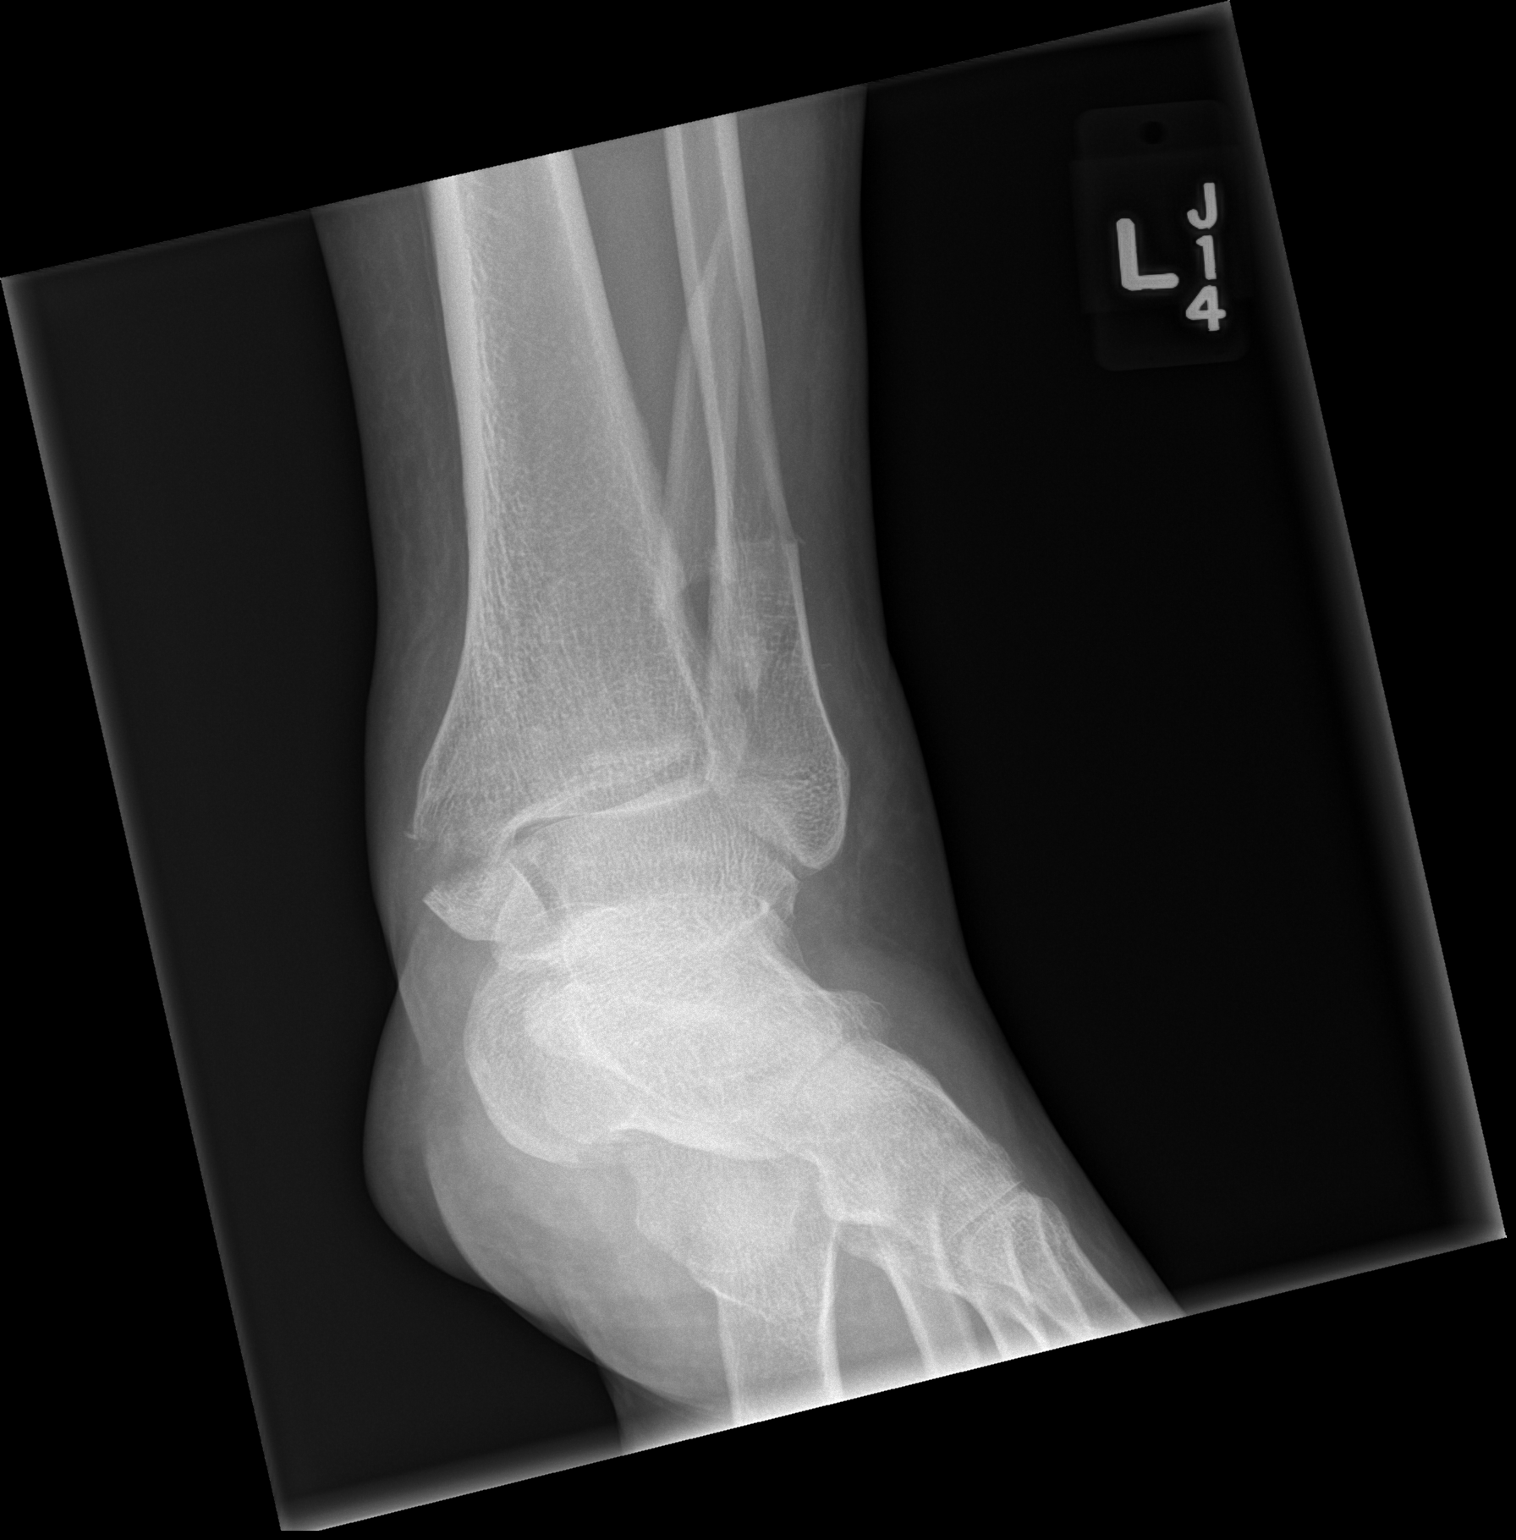

[x ankle lat left]
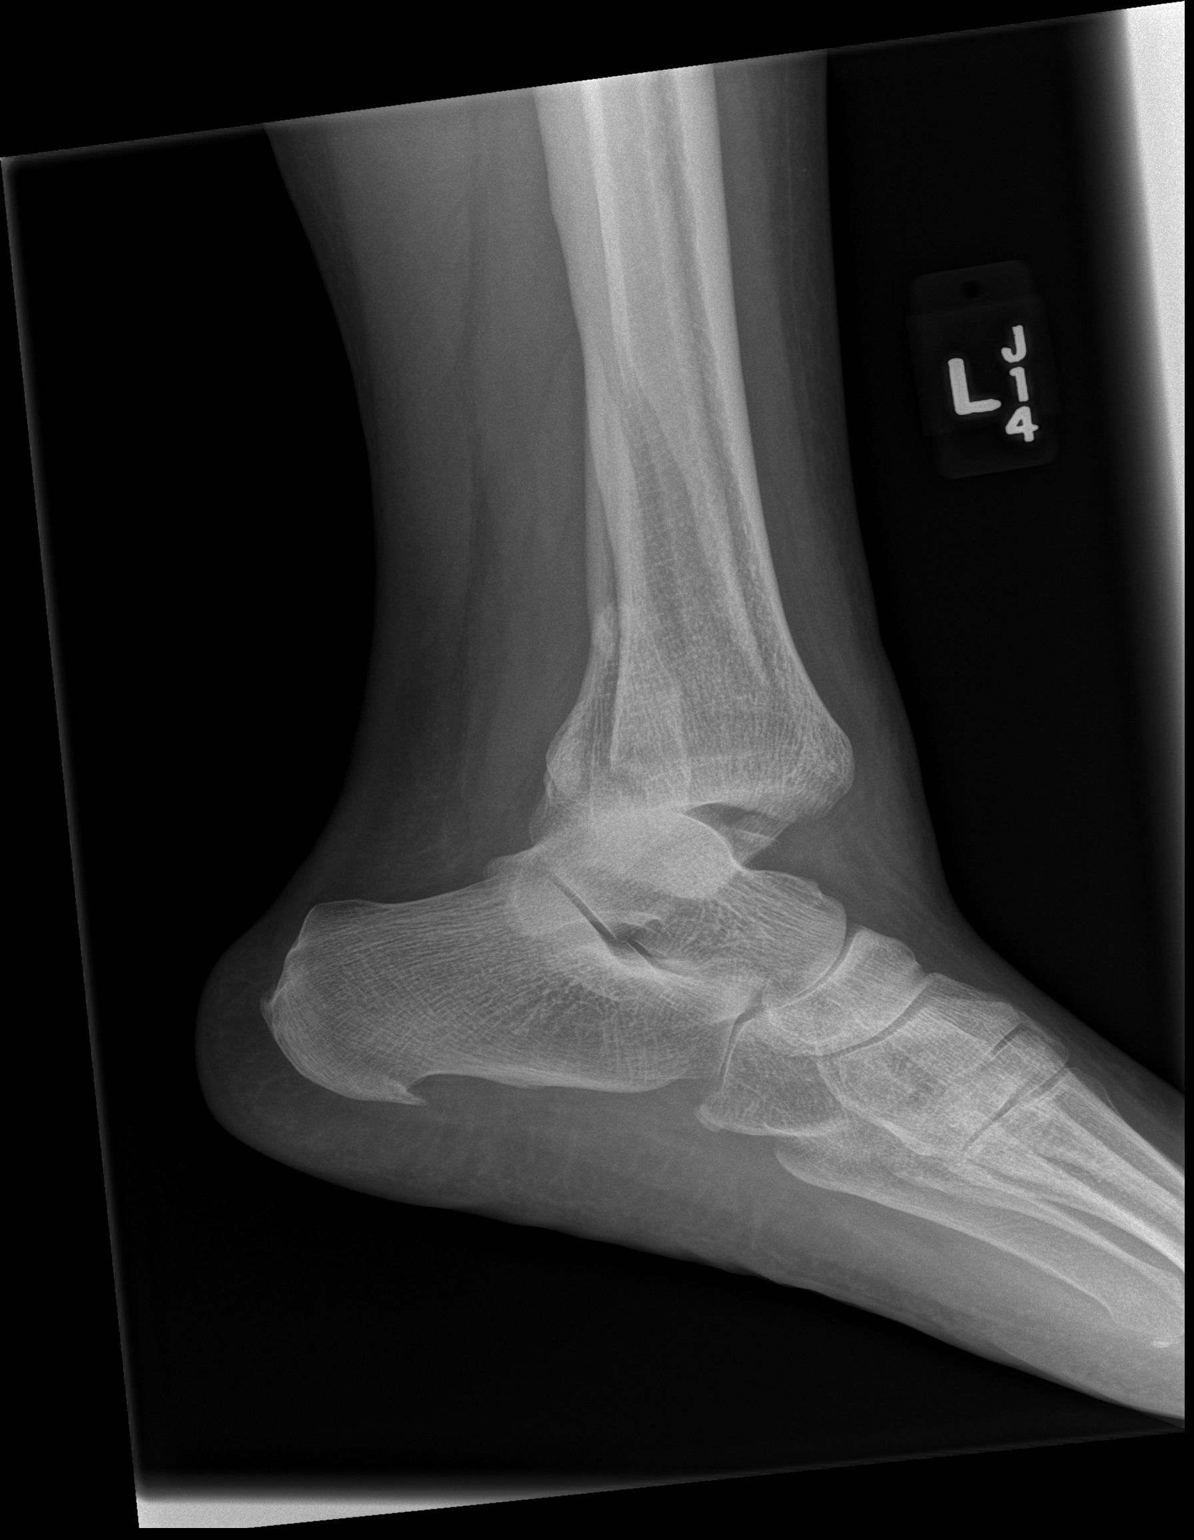

[x ankle obl left]
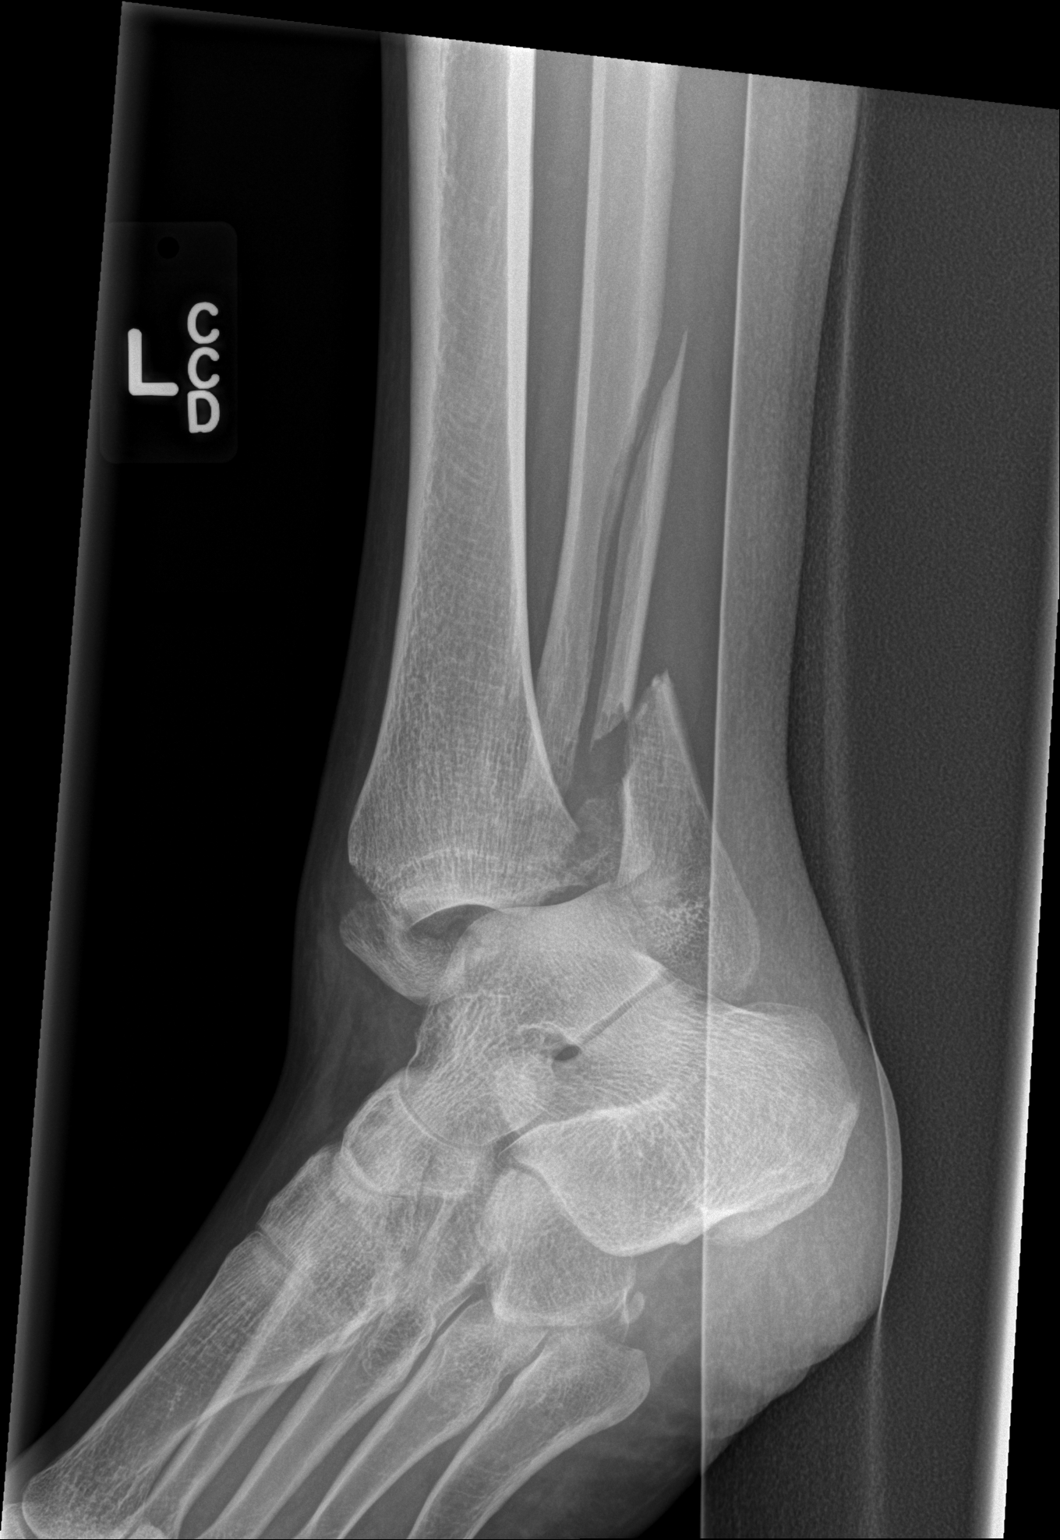

[3 of 3 positions shown; findings below may reference images not displayed]

FINDINGS: The patient has sustained a trimalleolar fracture dislocation of the
left ankle. There is complete disruption of the ankle joint mortise.
The distal fibular fracture has a spiral component extending into
the distal third of the diaphysis. The talar dome is intact. The
calcaneus is intact. There are small plantar and Achilles region
calcaneal spurs.
IMPRESSION: There is a comminuted trimalleolar fracture dislocation of the left
ankle.

## 2016-10-28 NOTE — H&P (Signed)
[name] is a 50 y.o.  P: 1-1-2-2  who presents for hysterectomy because of a long history of menorrhagia, endometriosis, polycystic ovarian syndrome  and uterine fibroids.  For the past two years the patient's menstrual periods  have  increased in volume.  At times her flow would result in soiling of her clothes or linen.  Her current flow, that lasts for 5 days, requires the change of a super plus tampon with a pad every 2 hours.  She has large clots and cramping that she rates 8/10 on a 10 point pain scale.  Ibuprofen 800 mg, however, relieves her cramping.  In 2016 the patient had an endometrial biopsy revealing secretory endometrium with no hyperplasia or carcinoma in preparation for an endometrial ablation procedure. Endometrial Ablation was not able to be completed however,  due to the patient's inability to tolerate the procedure.  She denies any intermenstrual bleeding, changes in bowel function or problem with urination though she may occasionally leak urine with laughing.  A pelvic ultrasound in September 2017 showed an anteverted uterus measuring 10.84 x 5.57 x 6.28 cm, endometrium-0.61 cm; #2 intramural fibroids:  left lateral-1.19 x 1.15 x 0.87 cm and a fundal-1.26 x 0.78 x 1.28 cm;  right ovary-3.45 cm and left ovary-3.74 cm.  A review of  both medical and surgical management options were given to the patient, however, given the protracted and disruptive nature of her symptoms,  she has opted for definitive therapy in the form of hysterectomy.   Past Medical History  OB History: G: 4;  P: 1-1-2-2;  SVB: 1998 (8lbs. 14 oz.) and C-section: 2004  GYN History: menarche: 50 YO    LMP: 10/03/2016    Contracepton vasectomy  The patient denies history of sexually transmitted disease.  Has a history of abnormal PAP smear in 1993 that was treated with cryotherapy and have been normal since.  Last PAP smear: 2015-normal  Medical History: Hypercholesterolemia, Anemia, Left Ankle Fracture, Exercise  Induced Asthma, Depression, Polycystic Ovarian Syndrome and  Migraine.   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  Denies tobacco use and occasionally uses alcohol  Medications:  Dulera 200 mcg-73mcg Inhaler  as directed Albuterol Inhaler 2 puffs every 6 hours as needed Estradiol 0.1 mg/24hr Patch twice a week as directed Relpax 50 mg  1 po stat prn Topiramate 50 mg po qd  Allergies  Allergen Reactions  . Ciprofloxacin Nausea Only    Denies sensitivity to peanuts, shellfish, soy, latex or adhesives.    ROS: Admits to glasses, leaking urine occasionally with laughing and  seasonal rhinitis but  denies headache, vision changes, dysphagia, tinnitus, dizziness, hoarseness, cough,  chest pain, shortness of breath, nausea, vomiting, diarrhea,constipation,  urinary frequency, urgency  dysuria, hematuria, vaginitis symptoms,  swelling of joints,easy bruising,  myalgias, arthralgias, skin rashes, unexplained weight loss and except as is mentioned in the history of present illness, patient's review of systems is otherwise negative.  Physical Exam    Bp:  110/68   P: 72 bpm    R: 16  Temperature: 98.2 degrees F orally   Weight:  217 lbs. Height: 5' 5.5 "  BMI: 35.6 Neck: supple without masses or thyromegaly Lungs: clear  to auscultation Heart: regular rate and rhythm Abdomen: soft, non-tender and no organomegaly Pelvic:EGBUS- wnl; vagina-normal rugae; uterus-10-12 weeks size and mobile cervix without lesions or motion tenderness; adnexae-no tenderness or masses Extremities:  no clubbing, cyanosis or edema   Assesment:   Menorrhagia            Endometriosis                       Uterine Fibroids    Disposition:  A discussion was held with patient regarding the indication for her procedure(s) along with the risks, which include but are not limited to: reaction to anesthesia, damage to adjacent organs, infection and excessive bleeding.  The patient verbalized understanding of these risks and has consented to proceed with a Total Abdominal Hysterectomy with Bilateral Salpingectomy at [facility] on November 26, 2016.   CSN# [id number]   [place] J. Florene Glen, PA-C  for Dr. Seymour Bars. Daryn Pisani

## 2016-10-30 ENCOUNTER — Other Ambulatory Visit: Payer: Self-pay | Admitting: Obstetrics and Gynecology

## 2016-11-12 NOTE — Patient Instructions (Addendum)
Your procedure is scheduled on:  Wednesday, Nov. 29, 2017  Enter through the [employer] of [facility] at:  6:00 AM  Pick up the phone at the desk and dial [phone].  Call this number if you have problems the morning of surgery: [phone].  Remember: Do NOT eat food or drink after:  Midnight Tuesday, Nov. 28, 2017  Take these medicines the morning of surgery with a SIP OF WATER:  None  Bring Asthma Inhaler day of surgery  Stop ALL herbal medications at this time   Do NOT wear jewelry (body piercing), metal hair clips/bobby pins, make-up, or nail polish. Do NOT wear lotions, powders, or perfumes.  You may wear deodorant. Do NOT shave for 48 hours prior to surgery. Do NOT bring valuables to the hospital. Contacts, dentures, or bridgework may not be worn into surgery.  Leave suitcase in car.  After surgery it may be brought to your room.  For patients admitted to the hospital, checkout time is 11:00 AM the day of discharge.  Bring a copy of the living will paper work day of surgery.

## 2016-11-14 ENCOUNTER — Encounter (HOSPITAL_COMMUNITY)
Admission: RE | Admit: 2016-11-14 | Discharge: 2016-11-14 | Disposition: A | Discharge status: Home / Self Care | Payer: BC Managed Care – PPO | Source: Ambulatory Visit | Attending: Obstetrics and Gynecology | Admitting: Obstetrics and Gynecology

## 2016-11-14 ENCOUNTER — Encounter (HOSPITAL_COMMUNITY): Payer: Self-pay

## 2016-11-14 DIAGNOSIS — Z01812 Encounter for preprocedural laboratory examination: Secondary | ICD-10-CM | POA: Insufficient documentation

## 2016-11-14 DIAGNOSIS — G43909 Migraine, unspecified, not intractable, without status migrainosus: Secondary | ICD-10-CM | POA: Diagnosis not present

## 2016-11-14 DIAGNOSIS — R922 Inconclusive mammogram: Secondary | ICD-10-CM | POA: Diagnosis not present

## 2016-11-14 DIAGNOSIS — N920 Excessive and frequent menstruation with regular cycle: Secondary | ICD-10-CM | POA: Insufficient documentation

## 2016-11-14 HISTORY — DX: Other fracture of unspecified lower leg, initial encounter for closed fracture: S82.899A

## 2016-11-14 HISTORY — DX: Other reaction to spinal and lumbar puncture: G97.1

## 2016-11-14 LAB — CBC
HCT: 35.5 % — ABNORMAL LOW (ref 36.0–46.0)
HEMOGLOBIN: 11.9 g/dL — AB (ref 12.0–15.0)
MCH: 26.2 pg (ref 26.0–34.0)
MCHC: 33.5 g/dL (ref 30.0–36.0)
MCV: 78 fL (ref 78.0–100.0)
PLATELETS: 283 10*3/uL (ref 150–400)
RBC: 4.55 MIL/uL (ref 3.87–5.11)
RDW: 13.9 % (ref 11.5–15.5)
WBC: 6.5 10*3/uL (ref 4.0–10.5)

## 2016-11-25 MED ORDER — DEXTROSE 5 % IV SOLN
2.0000 g | INTRAVENOUS | Status: AC
  Administered 2016-11-26: 2 g via INTRAVENOUS
  Filled 2016-11-25: qty 2

## 2016-11-26 ENCOUNTER — Ambulatory Visit (HOSPITAL_COMMUNITY): Payer: BC Managed Care – PPO | Admitting: Anesthesiology

## 2016-11-26 ENCOUNTER — Encounter (HOSPITAL_COMMUNITY)
Admission: RE | Disposition: A | Discharge status: Home / Self Care | Payer: Self-pay | Source: Ambulatory Visit | Attending: Obstetrics and Gynecology

## 2016-11-26 ENCOUNTER — Encounter (HOSPITAL_COMMUNITY): Payer: Self-pay

## 2016-11-26 ENCOUNTER — Inpatient Hospital Stay (HOSPITAL_COMMUNITY)
Admission: RE | Admit: 2016-11-26 | Discharge: 2016-11-27 | DRG: 743 | Disposition: A | Discharge status: Home / Self Care | Payer: BC Managed Care – PPO | Source: Ambulatory Visit | Attending: Obstetrics and Gynecology | Admitting: Obstetrics and Gynecology

## 2016-11-26 DIAGNOSIS — E669 Obesity, unspecified: Secondary | ICD-10-CM | POA: Diagnosis present

## 2016-11-26 DIAGNOSIS — Z6835 Body mass index (BMI) 35.0-35.9, adult: Secondary | ICD-10-CM | POA: Diagnosis not present

## 2016-11-26 DIAGNOSIS — D219 Benign neoplasm of connective and other soft tissue, unspecified: Secondary | ICD-10-CM | POA: Diagnosis present

## 2016-11-26 DIAGNOSIS — R319 Hematuria, unspecified: Secondary | ICD-10-CM | POA: Diagnosis not present

## 2016-11-26 DIAGNOSIS — E282 Polycystic ovarian syndrome: Secondary | ICD-10-CM | POA: Diagnosis present

## 2016-11-26 DIAGNOSIS — D649 Anemia, unspecified: Secondary | ICD-10-CM | POA: Diagnosis present

## 2016-11-26 DIAGNOSIS — N920 Excessive and frequent menstruation with regular cycle: Secondary | ICD-10-CM | POA: Diagnosis present

## 2016-11-26 DIAGNOSIS — D251 Intramural leiomyoma of uterus: Secondary | ICD-10-CM | POA: Diagnosis present

## 2016-11-26 DIAGNOSIS — N809 Endometriosis, unspecified: Secondary | ICD-10-CM | POA: Diagnosis present

## 2016-11-26 DIAGNOSIS — N800 Endometriosis of uterus: Secondary | ICD-10-CM | POA: Diagnosis present

## 2016-11-26 DIAGNOSIS — N8003 Adenomyosis of the uterus: Secondary | ICD-10-CM | POA: Diagnosis present

## 2016-11-26 DIAGNOSIS — N801 Endometriosis of ovary: Secondary | ICD-10-CM | POA: Diagnosis present

## 2016-11-26 HISTORY — PX: HYSTERECTOMY ABDOMINAL WITH SALPINGECTOMY: SHX6725

## 2016-11-26 HISTORY — PX: CYSTOSCOPY: SHX5120

## 2016-11-26 LAB — HEMOGLOBIN AND HEMATOCRIT, BLOOD
HCT: 26.7 % — ABNORMAL LOW (ref 36.0–46.0)
HEMOGLOBIN: 8.9 g/dL — AB (ref 12.0–15.0)

## 2016-11-26 LAB — TYPE AND SCREEN
ABO/RH(D): B POS
ANTIBODY SCREEN: NEGATIVE

## 2016-11-26 LAB — ABO/RH: ABO/RH(D): B POS

## 2016-11-26 LAB — PREGNANCY, URINE: Preg Test, Ur: NEGATIVE

## 2016-11-26 SURGERY — HYSTERECTOMY, TOTAL, ABDOMINAL, WITH SALPINGECTOMY
Anesthesia: General | Site: Bladder

## 2016-11-26 MED ORDER — BUPIVACAINE HCL (PF) 0.25 % IJ SOLN
INTRAMUSCULAR | Status: DC | PRN
  Administered 2016-11-26: 20 mL

## 2016-11-26 MED ORDER — PROPOFOL 10 MG/ML IV BOLUS
INTRAVENOUS | Status: AC
  Filled 2016-11-26: qty 20

## 2016-11-26 MED ORDER — MOMETASONE FURO-FORMOTEROL FUM 200-5 MCG/ACT IN AERO
2.0000 | INHALATION_SPRAY | Freq: Two times a day (BID) | RESPIRATORY_TRACT | Status: DC | PRN
  Filled 2016-11-26: qty 8.8

## 2016-11-26 MED ORDER — ROCURONIUM BROMIDE 100 MG/10ML IV SOLN
INTRAVENOUS | Status: DC | PRN
  Administered 2016-11-26: 10 mg via INTRAVENOUS
  Administered 2016-11-26: 20 mg via INTRAVENOUS
  Administered 2016-11-26: 10 mg via INTRAVENOUS
  Administered 2016-11-26: 20 mg via INTRAVENOUS
  Administered 2016-11-26: 30 mg via INTRAVENOUS
  Administered 2016-11-26: 20 mg via INTRAVENOUS
  Administered 2016-11-26 (×3): 10 mg via INTRAVENOUS

## 2016-11-26 MED ORDER — MICROFIBRILLAR COLL HEMOSTAT EX POWD
CUTANEOUS | Status: AC
  Filled 2016-11-26: qty 5

## 2016-11-26 MED ORDER — STERILE WATER FOR IRRIGATION IR SOLN
Status: DC | PRN
  Administered 2016-11-26: 1000 mL via INTRAVESICAL

## 2016-11-26 MED ORDER — FENTANYL CITRATE (PF) 250 MCG/5ML IJ SOLN
INTRAMUSCULAR | Status: AC
Start: 2016-11-26 — End: 2016-11-26
  Filled 2016-11-26: qty 5

## 2016-11-26 MED ORDER — DEXAMETHASONE SODIUM PHOSPHATE 4 MG/ML IJ SOLN
INTRAMUSCULAR | Status: DC | PRN
  Administered 2016-11-26: 10 mg via INTRAVENOUS

## 2016-11-26 MED ORDER — OXYCODONE-ACETAMINOPHEN 5-325 MG PO TABS
1.0000 | ORAL_TABLET | ORAL | Status: DC | PRN
  Administered 2016-11-26 – 2016-11-27 (×4): 1 via ORAL
  Filled 2016-11-26 (×4): qty 1

## 2016-11-26 MED ORDER — EPHEDRINE 5 MG/ML INJ
INTRAVENOUS | Status: AC
  Filled 2016-11-26: qty 10

## 2016-11-26 MED ORDER — HYDROMORPHONE HCL 1 MG/ML IJ SOLN
INTRAMUSCULAR | Status: AC
  Filled 2016-11-26: qty 1

## 2016-11-26 MED ORDER — DIPHENHYDRAMINE HCL 50 MG/ML IJ SOLN: 12.5000 mg | Freq: Four times a day (QID) | INTRAMUSCULAR | Status: DC | PRN

## 2016-11-26 MED ORDER — HYDROMORPHONE HCL 1 MG/ML IJ SOLN: 0.2500 mg | INTRAMUSCULAR | Status: DC | PRN

## 2016-11-26 MED ORDER — GLYCOPYRROLATE 0.2 MG/ML IJ SOLN
INTRAMUSCULAR | Status: DC | PRN
  Administered 2016-11-26: 0.6 mg via INTRAVENOUS
  Administered 2016-11-26: 0.1 mg via INTRAVENOUS

## 2016-11-26 MED ORDER — SCOPOLAMINE 1 MG/3DAYS TD PT72
MEDICATED_PATCH | TRANSDERMAL | Status: AC
  Administered 2016-11-26: 1.5 mg via TRANSDERMAL
  Filled 2016-11-26: qty 1

## 2016-11-26 MED ORDER — HYDROMORPHONE 1 MG/ML IV SOLN
INTRAVENOUS | Status: DC
  Filled 2016-11-26: qty 25

## 2016-11-26 MED ORDER — METOCLOPRAMIDE HCL 5 MG/ML IJ SOLN
INTRAMUSCULAR | Status: DC | PRN
  Administered 2016-11-26: 10 mg via INTRAVENOUS

## 2016-11-26 MED ORDER — NEOSTIGMINE METHYLSULFATE 10 MG/10ML IV SOLN
INTRAVENOUS | Status: DC | PRN
  Administered 2016-11-26: 3 mg via INTRAVENOUS

## 2016-11-26 MED ORDER — ONDANSETRON HCL 4 MG/2ML IJ SOLN: 4.0000 mg | Freq: Four times a day (QID) | INTRAMUSCULAR | Status: DC | PRN

## 2016-11-26 MED ORDER — MIDAZOLAM HCL 2 MG/2ML IJ SOLN
INTRAMUSCULAR | Status: AC
  Filled 2016-11-26: qty 2

## 2016-11-26 MED ORDER — TOPIRAMATE 25 MG PO TABS
50.0000 mg | ORAL_TABLET | Freq: Every day | ORAL | Status: DC
  Administered 2016-11-26: 50 mg via ORAL
  Filled 2016-11-26 (×2): qty 2

## 2016-11-26 MED ORDER — HYDROMORPHONE 1 MG/ML IV SOLN
INTRAVENOUS | Status: DC
  Administered 2016-11-26: 0.8 mg via INTRAVENOUS
  Administered 2016-11-26: 15:00:00 via INTRAVENOUS

## 2016-11-26 MED ORDER — FENTANYL CITRATE (PF) 100 MCG/2ML IJ SOLN
INTRAMUSCULAR | Status: AC
  Filled 2016-11-26: qty 2

## 2016-11-26 MED ORDER — LIDOCAINE HCL (CARDIAC) 20 MG/ML IV SOLN
INTRAVENOUS | Status: AC
  Filled 2016-11-26: qty 5

## 2016-11-26 MED ORDER — ONDANSETRON HCL 4 MG/2ML IJ SOLN
INTRAMUSCULAR | Status: AC
  Filled 2016-11-26: qty 2

## 2016-11-26 MED ORDER — DEXAMETHASONE SODIUM PHOSPHATE 10 MG/ML IJ SOLN
INTRAMUSCULAR | Status: AC
  Filled 2016-11-26: qty 1

## 2016-11-26 MED ORDER — METOCLOPRAMIDE HCL 5 MG/ML IJ SOLN
INTRAMUSCULAR | Status: AC
  Filled 2016-11-26: qty 2

## 2016-11-26 MED ORDER — IBUPROFEN 600 MG PO TABS
600.0000 mg | ORAL_TABLET | Freq: Four times a day (QID) | ORAL | Status: DC | PRN
  Administered 2016-11-27: 600 mg via ORAL
  Filled 2016-11-26: qty 1

## 2016-11-26 MED ORDER — SODIUM CHLORIDE 0.9% FLUSH: 9.0000 mL | INTRAVENOUS | Status: DC | PRN

## 2016-11-26 MED ORDER — 0.9 % SODIUM CHLORIDE (POUR BTL) OPTIME
TOPICAL | Status: DC | PRN
  Administered 2016-11-26: 2000 mL

## 2016-11-26 MED ORDER — SUCCINYLCHOLINE CHLORIDE 20 MG/ML IJ SOLN
INTRAMUSCULAR | Status: DC | PRN
  Administered 2016-11-26: 110 mg via INTRAVENOUS

## 2016-11-26 MED ORDER — LACTATED RINGERS IV SOLN
INTRAVENOUS | Status: DC
  Administered 2016-11-26 – 2016-11-27 (×3): via INTRAVENOUS

## 2016-11-26 MED ORDER — ONDANSETRON HCL 4 MG/2ML IJ SOLN
INTRAMUSCULAR | Status: DC | PRN
  Administered 2016-11-26: 4 mg via INTRAVENOUS

## 2016-11-26 MED ORDER — FENTANYL CITRATE (PF) 100 MCG/2ML IJ SOLN
INTRAMUSCULAR | Status: DC | PRN
  Administered 2016-11-26 (×4): 50 ug via INTRAVENOUS
  Administered 2016-11-26: 100 ug via INTRAVENOUS

## 2016-11-26 MED ORDER — PROPOFOL 10 MG/ML IV BOLUS
INTRAVENOUS | Status: DC | PRN
  Administered 2016-11-26: 200 mg via INTRAVENOUS

## 2016-11-26 MED ORDER — SUCCINYLCHOLINE CHLORIDE 200 MG/10ML IV SOSY
PREFILLED_SYRINGE | INTRAVENOUS | Status: AC
  Filled 2016-11-26: qty 10

## 2016-11-26 MED ORDER — MENTHOL 3 MG MT LOZG: 1.0000 | LOZENGE | OROMUCOSAL | Status: DC | PRN

## 2016-11-26 MED ORDER — ONDANSETRON HCL 4 MG PO TABS: 4.0000 mg | ORAL_TABLET | Freq: Three times a day (TID) | ORAL | Status: DC | PRN

## 2016-11-26 MED ORDER — MIDAZOLAM HCL 2 MG/2ML IJ SOLN
INTRAMUSCULAR | Status: DC | PRN
  Administered 2016-11-26: 0.5 mg via INTRAVENOUS
  Administered 2016-11-26: 1 mg via INTRAVENOUS

## 2016-11-26 MED ORDER — MICROFIBRILLAR COLL HEMOSTAT EX POWD
CUTANEOUS | Status: DC | PRN
  Administered 2016-11-26: 1 g via TOPICAL

## 2016-11-26 MED ORDER — ALBUTEROL SULFATE (2.5 MG/3ML) 0.083% IN NEBU: 3.0000 mL | INHALATION_SOLUTION | Freq: Four times a day (QID) | RESPIRATORY_TRACT | Status: DC | PRN

## 2016-11-26 MED ORDER — PROMETHAZINE HCL 25 MG/ML IJ SOLN: 6.2500 mg | INTRAMUSCULAR | Status: DC | PRN

## 2016-11-26 MED ORDER — FENTANYL CITRATE (PF) 100 MCG/2ML IJ SOLN: 25.0000 ug | INTRAMUSCULAR | Status: DC | PRN

## 2016-11-26 MED ORDER — BUPIVACAINE HCL (PF) 0.25 % IJ SOLN
INTRAMUSCULAR | Status: AC
  Filled 2016-11-26: qty 30

## 2016-11-26 MED ORDER — NALOXONE HCL 0.4 MG/ML IJ SOLN: 0.4000 mg | INTRAMUSCULAR | Status: DC | PRN

## 2016-11-26 MED ORDER — ROCURONIUM BROMIDE 100 MG/10ML IV SOLN
INTRAVENOUS | Status: AC
  Filled 2016-11-26: qty 1

## 2016-11-26 MED ORDER — NEOSTIGMINE METHYLSULFATE 10 MG/10ML IV SOLN
INTRAVENOUS | Status: AC
  Filled 2016-11-26: qty 1

## 2016-11-26 MED ORDER — EPHEDRINE SULFATE 50 MG/ML IJ SOLN
INTRAMUSCULAR | Status: DC | PRN
  Administered 2016-11-26: 5 mg via INTRAVENOUS
  Administered 2016-11-26: 10 mg via INTRAVENOUS

## 2016-11-26 MED ORDER — HYDROMORPHONE HCL 1 MG/ML IJ SOLN
INTRAMUSCULAR | Status: DC | PRN
  Administered 2016-11-26 (×2): 0.5 mg via INTRAVENOUS
  Administered 2016-11-26: 1 mg via INTRAVENOUS

## 2016-11-26 MED ORDER — DOCUSATE SODIUM 100 MG PO CAPS
100.0000 mg | ORAL_CAPSULE | Freq: Two times a day (BID) | ORAL | Status: DC
  Administered 2016-11-26 – 2016-11-27 (×2): 100 mg via ORAL
  Filled 2016-11-26 (×2): qty 1

## 2016-11-26 MED ORDER — LACTATED RINGERS IV SOLN
INTRAVENOUS | Status: DC
  Administered 2016-11-26 (×5): via INTRAVENOUS

## 2016-11-26 MED ORDER — SCOPOLAMINE 1 MG/3DAYS TD PT72
1.0000 | MEDICATED_PATCH | Freq: Once | TRANSDERMAL | Status: DC
  Administered 2016-11-26: 1.5 mg via TRANSDERMAL

## 2016-11-26 MED ORDER — DIPHENHYDRAMINE HCL 12.5 MG/5ML PO ELIX: 12.5000 mg | ORAL_SOLUTION | Freq: Four times a day (QID) | ORAL | Status: DC | PRN

## 2016-11-26 MED ORDER — METHYLENE BLUE 0.5 % INJ SOLN
INTRAVENOUS | Status: AC
  Filled 2016-11-26: qty 10

## 2016-11-26 MED ORDER — METHYLENE BLUE 0.5 % INJ SOLN
INTRAVENOUS | Status: DC | PRN
  Administered 2016-11-26: 6 mL via INTRAVENOUS

## 2016-11-26 MED ORDER — KETOROLAC TROMETHAMINE 30 MG/ML IJ SOLN
30.0000 mg | Freq: Four times a day (QID) | INTRAMUSCULAR | Status: DC
  Administered 2016-11-26 – 2016-11-27 (×3): 30 mg via INTRAVENOUS
  Filled 2016-11-26 (×3): qty 1

## 2016-11-26 SURGICAL SUPPLY — 57 items
ADH SKN CLS APL DERMABOND .7 (GAUZE/BANDAGES/DRESSINGS) ×2
CANISTER SUCT 3000ML (MISCELLANEOUS) ×4 IMPLANT
CATH ROBINSON RED A/P 16FR (CATHETERS) IMPLANT
CLOSURE WOUND 1/2 X4 (GAUZE/BANDAGES/DRESSINGS) ×1
CLOTH BEACON ORANGE TIMEOUT ST (SAFETY) ×4 IMPLANT
CONT PATH 16OZ SNAP LID 3702 (MISCELLANEOUS) ×4 IMPLANT
DECANTER SPIKE VIAL GLASS SM (MISCELLANEOUS) ×2 IMPLANT
DERMABOND ADVANCED (GAUZE/BANDAGES/DRESSINGS) ×2
DERMABOND ADVANCED .7 DNX12 (GAUZE/BANDAGES/DRESSINGS) IMPLANT
DRAIN JACKSON PRT FLT 7MM (DRAIN) IMPLANT
DRAPE HALF SHEET 40X57 (DRAPES) ×2 IMPLANT
DRAPE HYSTEROSCOPY (DRAPE) ×2 IMPLANT
DRAPE WARM FLUID 44X44 (DRAPE) ×2 IMPLANT
DRSG OPSITE POSTOP 4X10 (GAUZE/BANDAGES/DRESSINGS) ×4 IMPLANT
DURAPREP 26ML APPLICATOR (WOUND CARE) ×4 IMPLANT
ELECT BLADE 6.5 EXT (BLADE) ×2 IMPLANT
ELECT CAUTERY BLADE 6.4 (BLADE) ×2 IMPLANT
ELECT NDL TIP 2.8 STRL (NEEDLE) IMPLANT
ELECT NEEDLE TIP 2.8 STRL (NEEDLE) IMPLANT
EVACUATOR SILICONE 100CC (DRAIN) IMPLANT
GAUZE SPONGE 4X4 16PLY XRAY LF (GAUZE/BANDAGES/DRESSINGS) ×4 IMPLANT
GLOVE BIOGEL PI IND STRL 7.0 (GLOVE) ×2 IMPLANT
GLOVE BIOGEL PI INDICATOR 7.0 (GLOVE) ×2
GLOVE SURG SS PI 6.5 STRL IVOR (GLOVE) ×8 IMPLANT
GOWN STRL REUS W/TWL LRG LVL3 (GOWN DISPOSABLE) ×12 IMPLANT
LEGGING LITHOTOMY PAIR STRL (DRAPES) ×2 IMPLANT
NDL SPNL 22GX3.5 QUINCKE BK (NEEDLE) ×2 IMPLANT
NEEDLE HYPO 22GX1.5 SAFETY (NEEDLE) ×2 IMPLANT
NEEDLE SPNL 22GX3.5 QUINCKE BK (NEEDLE) ×8 IMPLANT
NS IRRIG 1000ML POUR BTL (IV SOLUTION) ×6 IMPLANT
PACK ABDOMINAL GYN (CUSTOM PROCEDURE TRAY) ×4 IMPLANT
PAD OB MATERNITY 4.3X12.25 (PERSONAL CARE ITEMS) ×4 IMPLANT
PENCIL SMOKE EVAC W/HOLSTER (ELECTROSURGICAL) ×4 IMPLANT
PROTECTOR NERVE ULNAR (MISCELLANEOUS) ×6 IMPLANT
SPONGE LAP 18X18 X RAY DECT (DISPOSABLE) ×8 IMPLANT
STAPLER VISISTAT 35W (STAPLE) IMPLANT
STRIP CLOSURE SKIN 1/2X4 (GAUZE/BANDAGES/DRESSINGS) ×1 IMPLANT
SUT MNCRL AB 3-0 PS2 27 (SUTURE) ×2 IMPLANT
SUT PDS AB 1 CT  36 (SUTURE)
SUT PDS AB 1 CT 36 (SUTURE) IMPLANT
SUT PLAIN 2 0 XLH (SUTURE) ×2 IMPLANT
SUT SILK 0 FSL (SUTURE) IMPLANT
SUT VIC AB 0 CT1 18XCR BRD8 (SUTURE) ×6 IMPLANT
SUT VIC AB 0 CT1 27 (SUTURE) ×4
SUT VIC AB 0 CT1 27XBRD ANBCTR (SUTURE) IMPLANT
SUT VIC AB 0 CT1 27XCR 8 STRN (SUTURE) ×2 IMPLANT
SUT VIC AB 0 CT1 8-18 (SUTURE) ×12
SUT VIC AB 2-0 CT1 (SUTURE) ×4 IMPLANT
SUT VIC AB 3-0 SH 27 (SUTURE)
SUT VIC AB 3-0 SH 27X BRD (SUTURE) IMPLANT
SUT VICRYL 0 TIES 12 18 (SUTURE) ×4 IMPLANT
SYR 50ML LL SCALE MARK (SYRINGE) IMPLANT
SYR CONTROL 10ML LL (SYRINGE) ×2 IMPLANT
SYR TB 1ML 25GX5/8 (SYRINGE) IMPLANT
TOWEL OR 17X24 6PK STRL BLUE (TOWEL DISPOSABLE) ×8 IMPLANT
TRAY FOLEY CATH SILVER 14FR (SET/KITS/TRAYS/PACK) ×6 IMPLANT
WATER STERILE IRR 1000ML POUR (IV SOLUTION) ×2 IMPLANT

## 2016-11-26 NOTE — Progress Notes (Signed)
Day of Surgery Procedure(s) (LRB): HYSTERECTOMY ABDOMINAL WITH SALPINGECTOMY, right ovarian biopsy (Bilateral) CYSTOSCOPY (N/A)  Subjective: Patient reports nausea only with PCA dilaudid.  No vomiting, incisional pain well managed with PCA.   Ttolerating PO.    Objective: BP (!) 116/57 (BP Location: Left Arm)   Pulse 87   Temp 99 F (37.2 C) (Oral)   Resp 17   Ht 5\' 5"  (1.651 m)   Wt 215 lb (97.5 kg)   LMP 11/21/2016 (Exact Date)   SpO2 100%   BMI 35.78 kg/m  I have reviewed patient's vital signs, intake and output and medications. General: alert and cooperative Resp: clear to auscultation bilaterally Cardio: regular rate and rhythm, S1, S2 normal, no murmur, click, rub or gallop GI: soft, non-tender; bowel sounds normal; no masses,  no organomegaly and incision: clean and dry dressing Extremities: extremities normal, atraumatic, no cyanosis or edema Vaginal Bleeding: none I/O last 3 completed shifts: In: 5000 [I.V.:5000] Out: 2250 [Urine:1250; Blood:1000] Total I/O In: 220 [P.O.:220] Out: 150 [Urine:150]  Results for orders placed or performed during the hospital encounter of 11/26/16 (from the past 24 hour(s))  Pregnancy, urine     Status: None   Collection Time: 11/26/16  6:00 AM  Result Value Ref Range   Preg Test, Ur NEGATIVE NEGATIVE  Type and screen [place]     Status: None   Collection Time: 11/26/16  9:47 AM  Result Value Ref Range   ABO/RH(D) B POS    Antibody Screen NEG    Sample Expiration 11/29/2016   ABO/Rh     Status: None   Collection Time: 11/26/16  9:47 AM  Result Value Ref Range   ABO/RH(D) B POS   Hemoglobin and hematocrit, blood     Status: Abnormal   Collection Time: 11/26/16 11:10 AM  Result Value Ref Range   Hemoglobin 8.9 (L) 12.0 - 15.0 g/dL   HCT 26.7 (L) 36.0 - 46.0 %   Assessment: s/p Procedure(s): HYSTERECTOMY ABDOMINAL WITH SALPINGECTOMY, right ovarian biopsy (Bilateral) CYSTOSCOPY (N/A): stable and  tolerating diet  Anemia consistent with blood loss.  Hemodynamically stable  Plan: Advance diet Encourage ambulation Advance to PO medication per pt request  LOS: 0 days    Dyer Klug P 11/26/2016, 7:30 PM

## 2016-11-26 NOTE — Anesthesia Postprocedure Evaluation (Signed)
Anesthesia Post Note  Patient: Teretha Ilg  Procedure(s) Performed: Procedure(s) (LRB): HYSTERECTOMY ABDOMINAL WITH SALPINGECTOMY, right ovarian biopsy (Bilateral) CYSTOSCOPY (N/A)  Patient location during evaluation: [facility] Anesthesia Type: General Level of consciousness: awake Pain management: pain level controlled Vital Signs Assessment: post-procedure vital signs reviewed and stable Respiratory status: spontaneous breathing Cardiovascular status: stable Postop Assessment: no signs of nausea or vomiting and adequate PO intake Anesthetic complications: no     Last Vitals:  Vitals:   11/26/16 1443 11/26/16 1503  BP: (!) 111/51   Pulse: 76   Resp: 16 15  Temp: 36.7 C     Last Pain:  Vitals:   11/26/16 1503  TempSrc:   PainSc: 0-No pain   Pain Goal: Patients Stated Pain Goal: 0 (11/26/16 1445)               Mubarak Bevens

## 2016-11-26 NOTE — Transfer of Care (Signed)
Immediate Anesthesia Transfer of Care Note  Patient: [name]  Procedure(s) Performed: Procedure(s): HYSTERECTOMY ABDOMINAL WITH SALPINGECTOMY, right ovarian biopsy (Bilateral) CYSTOSCOPY (N/A)  Patient Location: PACU  Anesthesia Type:General  Level of Consciousness: awake, alert  and oriented  Airway & Oxygen Therapy: Patient Spontanous Breathing and Patient connected to nasal cannula oxygen  Post-op Assessment: Report given to RN and Post -op Vital signs reviewed and stable  Post vital signs: Reviewed and stable  Last Vitals:  Vitals:   11/26/16 0617 11/26/16 1236  BP: 109/74   Pulse: 71 83  Resp: 16   Temp: 36.6 C 36.8 C    Last Pain:  Vitals:   11/26/16 0617  TempSrc: Oral      Patients Stated Pain Goal: 3 ([id number])  Complications: No apparent anesthesia complications

## 2016-11-26 NOTE — Anesthesia Procedure Notes (Signed)
Procedure Name: Intubation Date/Time: 11/26/2016 7:44 AM Performed by: Flossie Dibble Pre-anesthesia Checklist: Patient identified, Emergency Drugs available, Suction available, Patient being monitored and Timeout performed Patient Re-evaluated:Patient Re-evaluated prior to inductionOxygen Delivery Method: Circle system utilized Preoxygenation: Pre-oxygenation with 100% oxygen Intubation Type: IV induction Ventilation: Oral airway inserted - appropriate to patient size and Mask ventilation without difficulty Laryngoscope Size: Glidescope and 3 (LoPro 3) Grade View: Grade I Tube type: Oral Tube size: 7.0 mm Number of attempts: 2 (DVL with MAC 3 first then changed to glide scope.) Airway Equipment and Method: Video-laryngoscopy Secured at: 21 cm Dental Injury: Teeth and Oropharynx as per pre-operative assessment  Difficulty Due To: Difficulty was anticipated and Difficult Airway- due to large tongue

## 2016-11-26 NOTE — H&P (Signed)
History and Physical Interval Note:   11/26/2016   7:25 AM   [name]  has presented today for surgery, with the diagnosis of Menorrhagia, Uterine Fibroids, Endometriosis, Polycystic Ovaries  The various methods of treatment have been discussed with the patient and family. After consideration of risks, benefits and other options for treatment, the patient has consented to  Procedure(s): HYSTERECTOMY ABDOMINAL WITH SALPINGECTOMY as a surgical intervention .  I have reviewed the patients' chart and labs.  Questions were answered to the patient's satisfaction.     Eldred Manges  MD

## 2016-11-26 NOTE — Anesthesia Preprocedure Evaluation (Addendum)
Anesthesia Evaluation  Patient identified by MRN, date of birth, ID band Patient awake    Reviewed: Allergy & Precautions, NPO status , Patient's Chart, lab work & pertinent test results  History of Anesthesia Complications (+) POST - OP SPINAL HEADACHE and history of anesthetic complications  Airway Mallampati: III  TM Distance: >3 FB Neck ROM: Full    Dental  (+) Teeth Intact, Dental Advisory Given   Pulmonary asthma ,    Pulmonary exam normal breath sounds clear to auscultation       Cardiovascular Exercise Tolerance: Good negative cardio ROS Normal cardiovascular exam Rhythm:Regular Rate:Normal     Neuro/Psych  Headaches, negative psych ROS   GI/Hepatic negative GI ROS, Neg liver ROS,   Endo/Other  Obesity   Renal/GU negative Renal ROS     Musculoskeletal negative musculoskeletal ROS (+)   Abdominal   Peds  Hematology negative hematology ROS (+)   Anesthesia Other Findings Day of surgery medications reviewed with the patient.  Reproductive/Obstetrics                             Anesthesia Physical Anesthesia Plan  ASA: II  Anesthesia Plan: General   Post-op Pain Management:    Induction: Intravenous  Airway Management Planned: Oral ETT  Additional Equipment:   Intra-op Plan:   Post-operative Plan: Extubation in OR  Informed Consent: I have reviewed the patients History and Physical, chart, labs and discussed the procedure including the risks, benefits and alternatives for the proposed anesthesia with the patient or authorized representative who has indicated his/her understanding and acceptance.   Dental advisory given  Plan Discussed with: CRNA  Anesthesia Plan Comments: (Risks/benefits of general anesthesia discussed with patient including risk of damage to teeth, lips, gum, and tongue, nausea/vomiting, allergic reactions to medications, and the possibility of  heart attack, stroke and death.  All patient questions answered.  Patient wishes to proceed.)        Anesthesia Quick Evaluation

## 2016-11-26 NOTE — Op Note (Signed)
OPERATIVE NOTE  [name]  DOB:    September 29, 1966  MRN:    [id number]  CSN:    [id number]  Date of Surgery:  11/26/2016  Preoperative Diagnosis:  Hematuria following total abdominal hysterectomy  Postoperative Diagnosis:  Same  Procedure:  Cystoscopy  Surgeon:  Gildardo Cranker, M.D.  Assistant:  Dr. Kendall Flack  Anesthetic:  General  Disposition:  The patient had a total abdominal hysterectomy for fibroids and menorrhagia. Adhesions were noted between the uterus and the bladder. The patient was noted to have hematuria at the conclusion of her surgery. Cystoscopy was thought to be appropriate.  Findings:  No lacerations or active bleeding was noted from the bladder mucosa. Urine quickly passed through both ureteral orifices. Pictures were taken. There was no evidence of pathology within the bladder. The urine was noted to be clear at the beginning and the end of our procedure.  Procedure:  The patient was given an ampule of methylene blue. The patient was placed in a modified lithotomy position. The Foley catheter was removed from her bladder. The perineum was prepped with Betadine. . The cystoscope was inserted into the bladder. Urine quickly passed through both ureteral orifices. The bladder mucosa was inspected, and no defects or pathology was noted. The cystoscope was removed. The Foley catheter was reinserted under sterile conditions.  Followup instructions:  The patient was returned to the supine position. She was transported to the recovery room in stable condition.  Gildardo Cranker, M.D.  11/26/2016 1:22 PM

## 2016-11-26 NOTE — Plan of Care (Signed)
Problem: Safety: Goal: Ability to remain free from injury will improve Outcome: Progressing Remains free of injury.  Problem: Health Behavior/Discharge Planning: Goal: Ability to manage health-related needs will improve Outcome: Progressing Still needs minimal assist with health related needs.  Problem: Pain Managment: Goal: General experience of comfort will improve Outcome: Progressing Improved comfort postoperatively.  Problem: Physical Regulation: Goal: Ability to maintain clinical measurements within normal limits will improve Outcome: Progressing Vital signs within normal.  Repeat lab work in AM. Goal: Will remain free from infection Outcome: Progressing No signs and symptoms of infection.  Problem: Tissue Perfusion: Goal: Risk factors for ineffective tissue perfusion will decrease Outcome: Progressing Adequate tissue perfusion noted.  Problem: Activity: Goal: Risk for activity intolerance will decrease Outcome: Progressing Activity with assistance.  Problem: Fluid Volume: Goal: Ability to maintain a balanced intake and output will improve Outcome: Progressing Fair appetite.  Adequate urine output.  Problem: Nutrition: Goal: Adequate nutrition will be maintained Outcome: Progressing Fair appetite.  Problem: Bowel/Gastric: Goal: Will not experience complications related to bowel motility Outcome: Progressing Good bowel sounds.

## 2016-11-26 NOTE — Op Note (Signed)
11/26/2016  12:18 PM  PATIENT:  [name]  50 y.o. female MRN:  [id number]  PRE-OPERATIVE DIAGNOSIS:  Menorrhagia, Uterine Fibroids, Endometriosis, Polycystic Ovaries  POST-OPERATIVE DIAGNOSIS:  Menorrhagia, Uterine Fibroids, Endometriosis, Polycystic Ovaries  PROCEDURE:  Procedure(s): HYSTERECTOMY ABDOMINAL WITH SALPINGECTOMY, right ovarian biopsy CYSTOSCOPY  SURGEON:  Surgeon(s): Eldred Manges, MD Ena Dawley, MD Ena Dawley, MD  ASSISTANTS: Florene Glen certified physician assistant   ANESTHESIA:  General  ESTIMATED BLOOD LOSS: [id number] cc  COMPLICATIONS: Hematuria , which prompted cystoscopy with no etiology for hematuria noted  BLOOD ADMINISTERED:none  DRAINS: Urinary Catheter (Foley)   LOCAL MEDICATIONS USED:  MARCAINE     SPECIMEN:  Source of Specimen:  1) uterine fundus       2) Uterine cervix      3)  bilateral portions of tube with a suture in the left tube      4)  ovarian biopsy  DISPOSITION OF SPECIMEN:  PATHOLOGY  COUNTS:  YES  FINDINGS:  The uterus was minimally enlarged with a small fibroid on the left lateral portion of the uterus. The tubes were within normal limits bilaterally. The ovary on the right side contains several dark areas just beneath the cortex that could be consistent with endometriosis and this was sent for biopsy. The left ovary was within normal limits. Neither ovary had significant adhesive disease. There were some filmy adhesions between the posterior uterus and the bowel. There was significant adhesions anteriorly on the cervix between the cervix and the bladder, making dissection of the bladder off the anterior cervix much more difficult.  PROCEDURE: The patient was taken to the operating room after appropriate identification and placed on the operating table in the supine position. Equipment for the induction of general anesthesia was placed and after the attainment of adequate general anesthesia the  perineum, and vagina were  prepped with multiple layers of Betadine.  the abdomen was prepped with ChloraPrep.  A timeout was performed. a Foley catheter was inserted into the bladder under sterile conditions and connected to straight drainage. The abdomen was draped as a sterile field.  Suprapubic injection of 10 cc of quarter percent Marcaine was undertaken. A suprapubic incision was made  at the site of the 2 previous suprapubic incisions. and the abdomen opened in layers. Peritoneum was entered and a self-retaining retractor placed A bladder blade could not be placed because of the depth of the pelvis and a Deaver retractor was used for bladder retraction.  After visual and palpable intraperitoneal evaluation of the anatomy was carried out large Kelly clamps were placed at the cornual regions of the uterus. The left round ligament was then identified clamped cut and suture ligated. That incision was taken anteriorly on the anterior leaf of the broad ligament. The utero-ovarian ligament was identified clamped cut, free tied and suture-ligated. A similar procedure was carried out on the opposite side with the round ligament and utero-ovarian ligament. The bladder flap was completed on the opposite side with incision of the anterior leaf of the broad ligament. The bladder was dissected off the anterior cervix with a combination of blunt and sharp dissection. The uterine arteries on the right and left side were clamped cut and suture ligated.  Parametial tissues on the right and left side were clamped, cut and suture-ligated down to the level of the cervix.  The uterine fundus was excised and removed from the operative field.  The bladder was further dissected off the anterior cervix sharply given that it was significantly  adherent probably due to the patient's prior surgical procedures.  The cervical tissues were then clamped cut and suture ligated. Uterosacral ligaments were clamped cut suture-ligated and those sutures held. The vaginal  angles were clamped cut suture ligated and the sutures held. The remainder of the vagina was incised and the uterus and cervix were removed from the operative field. Vaginal cuff was closed with figure-of-eight sutures of 0 Vicryl paying special attention to each vaginal angle.. Copious irrigation was carried out. The sutures holding the vaginal angles and uterosacral ligaments were then tied together. The right distal portion of fallopian tube was then identified and elevated into a Kelly clamp which was placed across the remaining mesial salpinx. The portion of tube was excised and the mesial salpinx suture-ligated with a suture of 3-0 Vicryl. A similar procedure was carried out on the opposite side. The aforementioned areas on the right ovary that seem consistent with endometriosis were excised and sent for ovarian biopsy.  Hemostasis in the bladder flap region was achieved with a combination of Bovie cautery, sutures of 3-0 Vicryl and then finally Avitene in the area of the dissected bladder flap.  Hemostasis was noted to be adequate and all instruments were removed from the peritoneal cavity.  The abdominal peritoneum was closed using running suture of 2-0 Vicryl.  The rectus fascia was closed with running sutures of 0 Vicryl  from each apex to  the midline and tied in the midline. The subcutaneous tissue was made hemostatic with Bovie cautery and irrigated. The skin incision was closed with a subcuticular suture of 3-0 Monocryl.Dermabond and  Sterile dressing was applied. The patient was then placed in the lithotomy position for performance of cystoscopy by Dr. Raphael Gibney Because of the persistence of hematuria,will be dictated under a separate operative report.  .  Once the cystoscopy was complete,  the patient was awakened from general anesthesia and taken to the recovery room in satisfactory condition having tolerated the procedure well with  sponge and instrument counts correct.  PLAN OF CARE: Admit.  After postanesthesia care  PATIENT DISPOSITION:  PACU - hemodynamically stable.   Delay start of Pharmacological VTE agent (>24hrs) due to surgical blood loss or risk of bleeding:  Yes SCDs were used throughout the procedure   [name] P 12:18 PM

## 2016-11-27 ENCOUNTER — Encounter (HOSPITAL_COMMUNITY): Payer: Self-pay | Admitting: Obstetrics and Gynecology

## 2016-11-27 LAB — CBC
HEMATOCRIT: 25.8 % — AB (ref 36.0–46.0)
HEMOGLOBIN: 8.7 g/dL — AB (ref 12.0–15.0)
MCH: 26.4 pg (ref 26.0–34.0)
MCHC: 33.7 g/dL (ref 30.0–36.0)
MCV: 78.2 fL (ref 78.0–100.0)
Platelets: 251 10*3/uL (ref 150–400)
RBC: 3.3 MIL/uL — ABNORMAL LOW (ref 3.87–5.11)
RDW: 14.2 % (ref 11.5–15.5)
WBC: 10.4 10*3/uL (ref 4.0–10.5)

## 2016-11-27 MED ORDER — IBUPROFEN 600 MG PO TABS: ORAL_TABLET | ORAL | 1 refills | Status: DC

## 2016-11-27 MED ORDER — ONDANSETRON HCL 4 MG PO TABS: 4.0000 mg | ORAL_TABLET | Freq: Three times a day (TID) | ORAL | 0 refills | Status: DC | PRN

## 2016-11-27 MED ORDER — OXYCODONE-ACETAMINOPHEN 5-325 MG PO TABS: 1.0000 | ORAL_TABLET | Freq: Four times a day (QID) | ORAL | 0 refills | Status: DC | PRN

## 2016-11-27 NOTE — Progress Notes (Signed)
[name] is a58 y.o.  [id number]  Post Op Date # 1:  TAH/BS/LOA/Cystocopy  Subjective: Patient is Doing well postoperatively. Patient has Pain is controlled with current analgesics. Medications being used: prescription NSAID's including ketorolac 30 mg IV and narcotic analgesics including Percocet 5/325.  Ambulating in the halls without dizziness,   tolerating liquids and crackers without nausea but hasn't voided yet (Foley removed an hour ago).   Objective: Vital signs in last 24 hours: Temp:  [98.1 F (36.7 C)-99.5 F (37.5 C)] 99.5 F (37.5 C) (11/30 0010) Pulse Rate:  [71-87] 78 (11/30 0351) Resp:  [12-18] 18 (11/30 0351) BP: (97-116)/(51-64) 108/53 (11/30 0351) SpO2:  [99 %-100 %] 100 % (11/30 0351) Weight:  [215 lb (97.5 kg)] 215 lb (97.5 kg) (11/29 1503)  Intake/Output from previous day: 11/29 0701 - 11/30 0700 In: 6115 [P.O.:1115; I.V.:5000] Out: 5200 [Urine:4200] Intake/Output this shift: No intake/output data recorded.  Recent Labs Lab 11/26/16 1110 11/27/16 0605  WBC  --  10.4  HGB 8.9* 8.7*  HCT 26.7* 25.8*  PLT  --  251    No results for input(s): NA, K, CL, CO2, BUN, CREATININE, CALCIUM, PROT, BILITOT, ALKPHOS, ALT, AST, GLUCOSE in the last 168 hours.  Invalid input(s): LABALBU  EXAM: General: alert, cooperative and no distress Resp: clear to auscultation bilaterally Cardio: regular rate and rhythm, S1, S2 normal, no murmur, click, rub or gallop GI: Bowel sounds present in all 4 quadrants;  soft;  dressing clean/dry/intact. Extremities: Homans sign is negative, no sign of DVT and no calf tenderness.   Assessment: s/p Procedure(s): HYSTERECTOMY ABDOMINAL WITH SALPINGECTOMY, right ovarian biopsy CYSTOSCOPY: stable, progressing well and anemia  Plan: Advance diet Encourage ambulation Consider discharge home later today.  LOS: 1 day    Watt Geiler, PA-C 11/27/2016 7:27 AM

## 2016-11-27 NOTE — Discharge Instructions (Signed)
Call [facility] @ [phone] if:  You have a temperature greater than or equal to 100.4 degrees Farenheit orally You have pain that is not made better by the pain medication given and taken as directed You have excessive bleeding or problems urinating  Take Colace (Docusate Sodium/Stool Softener) 100 mg 2-3 times daily while taking narcotic pain medicine to avoid constipation or until bowel movements are regular. Take Ibuprofen 600 mg with food every 6 hours for 5 days then as needed for pain Take over the counter iron supplement of your choice,  twice a day for the next 6 months  You may drive after 2 weeks You may walk up steps  You may shower  You may resume a regular diet  Keep incisions clean and dry,  remove honeycomb dressing on December 02, 2016. Do not lift over 15 pounds for 6 weeks Avoid anything in vagina for 6 weeks (or until after your post-operative visit)

## 2016-11-27 NOTE — Discharge Summary (Signed)
Physician Discharge Summary  Patient ID: [name] MRN: [id number] DOB/AGE: 01-07-1966 50 y.o.  Admit date: 11/26/2016 Discharge date: 11/27/2016   Discharge Diagnoses:  Active Problems:   Menorrhagia   Endometriosis   Fibroids   Adenomyosis   Operation: Total Abdominal Hysterectomy, Bilateral Salpingectomy, Lysis of Adhesions and Cystoscopy   Discharged Condition: Good  Hospital Course: On the date of admission the patient underwent the aforementioned procedures and tolerated them well.  Post operative course was unremarkable with the patient resuming bowel and bladder function by post operative day #1 and tolerating a hemoglobin of 8.7.  Having received the maximum benefit of her hospital stay,  the patient was discharged home on post operative day #1.  Disposition: 01-Home or Self Care  Discharge Medications:    Medication List    TAKE these medications   albuterol 108 (90 Base) MCG/ACT inhaler Commonly known as:  PROVENTIL HFA;VENTOLIN HFA Inhale 2 puffs into the lungs every 6 (six) hours as needed for wheezing or shortness of breath.   DULERA 200-5 MCG/ACT Aero Generic drug:  mometasone-formoterol Inhale 2 puffs into the lungs 2 (two) times daily as needed for wheezing or shortness of breath.   eletriptan 20 MG tablet Commonly known as:  RELPAX Take 20 mg by mouth every 2 (two) hours as needed for migraine. may repeat in 2 hours if necessary   estradiol 0.1 MG/24HR patch Commonly known as:  VIVELLE-DOT 1 patch 2 (two) times a week.   ibuprofen 600 MG tablet Commonly known as:  ADVIL,MOTRIN 1  po pc every 6 hours for 5 days then prn-pain   loratadine 10 MG tablet Commonly known as:  CLARITIN Take 10 mg by mouth daily as needed for allergies.   magnesium oxide 400 (241.3 Mg) MG tablet Commonly known as:  MAG-OX Take 1 tablet by mouth daily.   Melatonin 2.5 MG Chew Chew 1 tablet by mouth at bedtime as needed (sleep).   ondansetron 4 MG  tablet Commonly known as:  ZOFRAN Take 1 tablet (4 mg total) by mouth every 8 (eight) hours as needed for nausea or vomiting.   oxyCODONE-acetaminophen 5-325 MG tablet Commonly known as:  PERCOCET/ROXICET Take 1-2 tablets by mouth every 6 (six) hours as needed for severe pain (moderate to severe pain (when tolerating fluids)).   topiramate 50 MG tablet Commonly known as:  TOPAMAX Take 50 mg by mouth at bedtime.          Follow-up:  Dr. Lorriane Shire P. Haygood in 6 weeks     Signed: Dante Roudebush, PA-C 11/27/2016, 7:44 AM

## 2016-11-27 NOTE — Progress Notes (Signed)
Discharge teaching complete with patient. Pt understood all information and did not have any questions. Pt ambulate out of the house and discharged home to family.

## 2016-12-03 ENCOUNTER — Encounter (HOSPITAL_COMMUNITY): Payer: Self-pay | Admitting: *Deleted

## 2016-12-03 ENCOUNTER — Inpatient Hospital Stay (HOSPITAL_COMMUNITY): Payer: BC Managed Care – PPO

## 2016-12-03 ENCOUNTER — Inpatient Hospital Stay (HOSPITAL_COMMUNITY)
Admission: AD | Admit: 2016-12-03 | Discharge: 2016-12-06 | DRG: 864 | Disposition: A | Discharge status: Home / Self Care | Payer: BC Managed Care – PPO | Source: Ambulatory Visit | Attending: Obstetrics and Gynecology | Admitting: Obstetrics and Gynecology

## 2016-12-03 DIAGNOSIS — R509 Fever, unspecified: Secondary | ICD-10-CM | POA: Diagnosis present

## 2016-12-03 DIAGNOSIS — R5082 Postprocedural fever: Secondary | ICD-10-CM | POA: Diagnosis present

## 2016-12-03 DIAGNOSIS — J45909 Unspecified asthma, uncomplicated: Secondary | ICD-10-CM | POA: Diagnosis present

## 2016-12-03 DIAGNOSIS — D500 Iron deficiency anemia secondary to blood loss (chronic): Secondary | ICD-10-CM | POA: Diagnosis present

## 2016-12-03 DIAGNOSIS — T814XXA Infection following a procedure, initial encounter: Secondary | ICD-10-CM | POA: Diagnosis present

## 2016-12-03 DIAGNOSIS — G8918 Other acute postprocedural pain: Secondary | ICD-10-CM

## 2016-12-03 DIAGNOSIS — R102 Pelvic and perineal pain: Secondary | ICD-10-CM | POA: Diagnosis not present

## 2016-12-03 DIAGNOSIS — N739 Female pelvic inflammatory disease, unspecified: Secondary | ICD-10-CM | POA: Diagnosis present

## 2016-12-03 DIAGNOSIS — N99840 Postprocedural hematoma of a genitourinary system organ or structure following a genitourinary system procedure: Secondary | ICD-10-CM | POA: Diagnosis present

## 2016-12-03 LAB — CBC WITH DIFFERENTIAL/PLATELET
BASOS ABS: 0 10*3/uL (ref 0.0–0.1)
BASOS PCT: 0 %
Eosinophils Absolute: 0.1 10*3/uL (ref 0.0–0.7)
Eosinophils Relative: 1 %
HEMATOCRIT: 30.6 % — AB (ref 36.0–46.0)
HEMOGLOBIN: 10.2 g/dL — AB (ref 12.0–15.0)
LYMPHS PCT: 16 %
Lymphs Abs: 1.8 10*3/uL (ref 0.7–4.0)
MCH: 26.4 pg (ref 26.0–34.0)
MCHC: 33.3 g/dL (ref 30.0–36.0)
MCV: 79.1 fL (ref 78.0–100.0)
MONO ABS: 0.6 10*3/uL (ref 0.1–1.0)
MONOS PCT: 6 %
NEUTROS ABS: 8.2 10*3/uL — AB (ref 1.7–7.7)
NEUTROS PCT: 77 %
Platelets: 414 10*3/uL — ABNORMAL HIGH (ref 150–400)
RBC: 3.87 MIL/uL (ref 3.87–5.11)
RDW: 13.6 % (ref 11.5–15.5)
WBC: 10.7 10*3/uL — ABNORMAL HIGH (ref 4.0–10.5)

## 2016-12-03 LAB — URINALYSIS, ROUTINE W REFLEX MICROSCOPIC
Bilirubin Urine: NEGATIVE
GLUCOSE, UA: NEGATIVE mg/dL
HGB URINE DIPSTICK: NEGATIVE
Ketones, ur: NEGATIVE mg/dL
LEUKOCYTES UA: NEGATIVE
Nitrite: NEGATIVE
Protein, ur: NEGATIVE mg/dL
SPECIFIC GRAVITY, URINE: 1.017 (ref 1.005–1.030)
pH: 5 (ref 5.0–8.0)

## 2016-12-03 LAB — POCT PREGNANCY, URINE: PREG TEST UR: NEGATIVE

## 2016-12-03 NOTE — MAU Provider Note (Signed)
History     CSN: [id number]  Arrival date and time: 12/03/16 2015   First Provider Initiated Contact with Patient 12/03/16 2132      Chief Complaint  Patient presents with  . Pelvic Pain   Pelvic Pain  The patient's primary symptoms include pelvic pain. This is a new problem. The current episode started today. The problem occurs constantly (constant fullness and pressure in lower abdomin and intermittent pain with urination. ). The problem has been gradually worsening. Pain severity now: 7/10 pain with urination. Back pain and lower abdominal pain about 3/10  The problem affects both (radiates to back. ) sides. She is not pregnant. Associated symptoms include abdominal pain, dysuria, frequency, nausea and urgency. Pertinent negatives include no chills, constipation, diarrhea, fever (highest 99.7 at home.) or vomiting. The symptoms are aggravated by urinating. She has tried oral narcotics (hot shower. ) for the symptoms. The treatment provided mild relief. Sexual activity: No intercourse since surgery  She uses hysterectomy for contraception. Her past medical history is significant for a gynecological surgery. (Hysterectomy on 11/26/16 )   Past Medical History:  Diagnosis Date  . Ankle fracture    left  . Asthma   . Migraine   . Spinal headache     Past Surgical History:  Procedure Laterality Date  . ANKLE FRACTURE SURGERY    . CESAREAN SECTION    . CYSTOSCOPY N/A 11/26/2016   Procedure: CYSTOSCOPY;  Surgeon: Ena Dawley, MD;  Location: [facility];  Service: Gynecology;  Laterality: N/A;  . HYSTERECTOMY ABDOMINAL WITH SALPINGECTOMY Bilateral 11/26/2016   Procedure: HYSTERECTOMY ABDOMINAL WITH SALPINGECTOMY, right ovarian biopsy;  Surgeon: Eldred Manges, MD;  Location: [facility];  Service: Gynecology;  Laterality: Bilateral;  . MYOMECTOMY      No family history on file.  Social History  Substance Use Topics  . Smoking status: Never Smoker  . Smokeless tobacco: Never Used  .  Alcohol use Yes     Comment: occ    Allergies:  Allergies  Allergen Reactions  . Ciprofloxacin Nausea Only    lightheaded    Prescriptions Prior to Admission  Medication Sig Dispense Refill Last Dose  . albuterol (PROVENTIL HFA;VENTOLIN HFA) 108 (90 BASE) MCG/ACT inhaler Inhale 2 puffs into the lungs every 6 (six) hours as needed for wheezing or shortness of breath.   Unknown at Unknown time  . DULERA 200-5 MCG/ACT AERO Inhale 2 puffs into the lungs 2 (two) times daily as needed for wheezing or shortness of breath.   3 Unknown at Unknown time  . eletriptan (RELPAX) 20 MG tablet Take 20 mg by mouth every 2 (two) hours as needed for migraine. may repeat in 2 hours if necessary    More than a month at Unknown time  . estradiol (VIVELLE-DOT) 0.1 MG/24HR patch 1 patch 2 (two) times a week.   11/26/2016 at Unknown time  . ibuprofen (ADVIL,MOTRIN) 600 MG tablet 1  po pc every 6 hours for 5 days then prn-pain 30 tablet 1   . loratadine (CLARITIN) 10 MG tablet Take 10 mg by mouth daily as needed for allergies.   11/25/2016 at Unknown time  . magnesium oxide (MAG-OX) 400 (241.3 Mg) MG tablet Take 1 tablet by mouth daily.  3 11/25/2016 at Unknown time  . Melatonin 2.5 MG CHEW Chew 1 tablet by mouth at bedtime as needed (sleep).   Past Month at Unknown time  . ondansetron (ZOFRAN) 4 MG tablet Take 1 tablet (4 mg total) by mouth  every 8 (eight) hours as needed for nausea or vomiting. 20 tablet 0   . oxyCODONE-acetaminophen (PERCOCET/ROXICET) 5-325 MG tablet Take 1-2 tablets by mouth every 6 (six) hours as needed for severe pain (moderate to severe pain (when tolerating fluids)). 30 tablet 0   . topiramate (TOPAMAX) 50 MG tablet Take 50 mg by mouth at bedtime.   11/25/2016 at Unknown time    Review of Systems  Constitutional: Negative for chills and fever (highest 99.7 at home.).  Gastrointestinal: Positive for abdominal pain and nausea. Negative for constipation, diarrhea and vomiting.   Genitourinary: Positive for dysuria, frequency, pelvic pain and urgency.   Physical Exam   Blood pressure 119/71, pulse 94, temperature 100.9 F (38.3 C), temperature source Oral, resp. rate 18, last menstrual period 11/21/2016.  Physical Exam  Nursing note and vitals reviewed. Constitutional: She is oriented to person, place, and time. She appears well-developed and well-nourished. No distress.  HENT:  Head: Normocephalic.  Cardiovascular: Normal rate.   Respiratory: Effort normal.  GI: Soft. There is no tenderness. There is no rebound.  Genitourinary:  Genitourinary Comments:  External: no lesion Vagina: small amount of tan discharge Cervix: SA, cuff appears normal  Uterus: SA Adnexa: NT   Neurological: She is alert and oriented to person, place, and time.  Skin: Skin is warm and dry.  Psychiatric: She has a normal mood and affect.    Results for orders placed or performed during the hospital encounter of 12/03/16 (from the past 24 hour(s))  Urinalysis, Routine w reflex microscopic     Status: None   Collection Time: 12/03/16  9:00 PM  Result Value Ref Range   Color, Urine YELLOW YELLOW   APPearance CLEAR CLEAR   Specific Gravity, Urine 1.017 1.005 - 1.030   pH 5.0 5.0 - 8.0   Glucose, UA NEGATIVE NEGATIVE mg/dL   Hgb urine dipstick NEGATIVE NEGATIVE   Bilirubin Urine NEGATIVE NEGATIVE   Ketones, ur NEGATIVE NEGATIVE mg/dL   Protein, ur NEGATIVE NEGATIVE mg/dL   Nitrite NEGATIVE NEGATIVE   Leukocytes, UA NEGATIVE NEGATIVE  Pregnancy, urine POC     Status: None   Collection Time: 12/03/16  9:11 PM  Result Value Ref Range   Preg Test, Ur NEGATIVE NEGATIVE  CBC with Differential/Platelet     Status: Abnormal   Collection Time: 12/03/16 10:12 PM  Result Value Ref Range   WBC 10.7 (H) 4.0 - 10.5 K/uL   RBC 3.87 3.87 - 5.11 MIL/uL   Hemoglobin 10.2 (L) 12.0 - 15.0 g/dL   HCT 30.6 (L) 36.0 - 46.0 %   MCV 79.1 78.0 - 100.0 fL   MCH 26.4 26.0 - 34.0 pg   MCHC 33.3  30.0 - 36.0 g/dL   RDW 13.6 11.5 - 15.5 %   Platelets 414 (H) 150 - 400 K/uL   Neutrophils Relative % 77 %   Neutro Abs 8.2 (H) 1.7 - 7.7 K/uL   Lymphocytes Relative 16 %   Lymphs Abs 1.8 0.7 - 4.0 K/uL   Monocytes Relative 6 %   Monocytes Absolute 0.6 0.1 - 1.0 K/uL   Eosinophils Relative 1 %   Eosinophils Absolute 0.1 0.0 - 0.7 K/uL   Basophils Relative 0 %   Basophils Absolute 0.0 0.0 - 0.1 K/uL   US Pelvis Complete  Result Date: 12/03/2016 CLINICAL DATA:  Post hysterectomy and salpingectomy one right prior. Lower abdominal pressure ptotic. EXAM: TRANSABDOMINAL ULTRASOUND OF PELVIS TECHNIQUE: Transabdominal ultrasound examination of the pelvis was performed including evaluation  of the uterus, ovaries, adnexal regions, and pelvic cul-de-sac. COMPARISON:  None. FINDINGS: Uterus Post hysterectomy.  No focal abnormality of the vaginal cuff. Right ovary Not visualized.  No adnexal mass. Left ovary Not visualized. No adnexal mass. Other findings: No abnormal free fluid. No evidence of pelvic fluid collection. IMPRESSION: Post hysterectomy. No sonographic evidence of pelvic fluid collection. Neither ovary was visualized. Technically limited exam. If further evaluation is warranted clinically, recommend CT. Electronically Signed   By: Jeb Levering M.D.   On: 12/03/2016 23:52   MAU Course  Procedures  MDM 2323: D/W Dr. Mancel Bale. Will get [place], and do speculum exam to evaluate the cuff. Call back with results.  0010: D.W Dr. Mancel Bale will admit patient  Assessment and Plan  Post-operative fever Dysuria Admit to GYN   Mathis Bud 12/03/2016, 9:39 PM

## 2016-12-03 NOTE — MAU Note (Signed)
Pt is s/p hysterectomy. Reports burning with urination today. Having pressure in lower abd.

## 2016-12-04 ENCOUNTER — Inpatient Hospital Stay (HOSPITAL_COMMUNITY): Payer: BC Managed Care – PPO

## 2016-12-04 ENCOUNTER — Encounter (HOSPITAL_COMMUNITY): Payer: Self-pay | Admitting: *Deleted

## 2016-12-04 ENCOUNTER — Other Ambulatory Visit (HOSPITAL_COMMUNITY): Payer: Self-pay | Admitting: Radiology

## 2016-12-04 DIAGNOSIS — R509 Fever, unspecified: Secondary | ICD-10-CM | POA: Diagnosis present

## 2016-12-04 DIAGNOSIS — N739 Female pelvic inflammatory disease, unspecified: Secondary | ICD-10-CM | POA: Diagnosis present

## 2016-12-04 DIAGNOSIS — D508 Other iron deficiency anemias: Secondary | ICD-10-CM | POA: Diagnosis not present

## 2016-12-04 DIAGNOSIS — R102 Pelvic and perineal pain: Secondary | ICD-10-CM | POA: Diagnosis present

## 2016-12-04 DIAGNOSIS — R5081 Fever presenting with conditions classified elsewhere: Secondary | ICD-10-CM

## 2016-12-04 DIAGNOSIS — F1099 Alcohol use, unspecified with unspecified alcohol-induced disorder: Secondary | ICD-10-CM | POA: Diagnosis not present

## 2016-12-04 DIAGNOSIS — D500 Iron deficiency anemia secondary to blood loss (chronic): Secondary | ICD-10-CM | POA: Diagnosis present

## 2016-12-04 DIAGNOSIS — R5082 Postprocedural fever: Secondary | ICD-10-CM | POA: Diagnosis present

## 2016-12-04 DIAGNOSIS — J45909 Unspecified asthma, uncomplicated: Secondary | ICD-10-CM | POA: Diagnosis present

## 2016-12-04 DIAGNOSIS — N99840 Postprocedural hematoma of a genitourinary system organ or structure following a genitourinary system procedure: Secondary | ICD-10-CM | POA: Diagnosis present

## 2016-12-04 DIAGNOSIS — T814XXA Infection following a procedure, initial encounter: Secondary | ICD-10-CM | POA: Diagnosis present

## 2016-12-04 LAB — CBC WITH DIFFERENTIAL/PLATELET
BASOS ABS: 0 10*3/uL (ref 0.0–0.1)
Basophils Relative: 0 %
Eosinophils Absolute: 0.1 10*3/uL (ref 0.0–0.7)
Eosinophils Relative: 1 %
HEMATOCRIT: 28.1 % — AB (ref 36.0–46.0)
HEMOGLOBIN: 9.4 g/dL — AB (ref 12.0–15.0)
LYMPHS PCT: 20 %
Lymphs Abs: 2.3 10*3/uL (ref 0.7–4.0)
MCH: 26 pg (ref 26.0–34.0)
MCHC: 33.5 g/dL (ref 30.0–36.0)
MCV: 77.6 fL — AB (ref 78.0–100.0)
MONO ABS: 0.7 10*3/uL (ref 0.1–1.0)
MONOS PCT: 6 %
NEUTROS ABS: 8.6 10*3/uL — AB (ref 1.7–7.7)
Neutrophils Relative %: 73 %
Platelets: 392 10*3/uL (ref 150–400)
RBC: 3.62 MIL/uL — ABNORMAL LOW (ref 3.87–5.11)
RDW: 13.6 % (ref 11.5–15.5)
WBC: 11.8 10*3/uL — ABNORMAL HIGH (ref 4.0–10.5)

## 2016-12-04 LAB — WET PREP, GENITAL
Clue Cells Wet Prep HPF POC: NONE SEEN
SPERM: NONE SEEN
Trich, Wet Prep: NONE SEEN
YEAST WET PREP: NONE SEEN

## 2016-12-04 LAB — CREATININE, SERUM
CREATININE: 0.74 mg/dL (ref 0.44–1.00)
GFR calc Af Amer: >60 mL/min (ref >60)
GFR calc non Af Amer: >60 mL/min (ref >60)

## 2016-12-04 MED ORDER — SENNOSIDES-DOCUSATE SODIUM 8.6-50 MG PO TABS
1.0000 | ORAL_TABLET | Freq: Every evening | ORAL | Status: DC | PRN
  Administered 2016-12-04: 1 via ORAL
  Filled 2016-12-04: qty 1

## 2016-12-04 MED ORDER — TOPIRAMATE 25 MG PO TABS
50.0000 mg | ORAL_TABLET | Freq: Every day | ORAL | Status: DC
  Administered 2016-12-04 – 2016-12-05 (×2): 50 mg via ORAL
  Filled 2016-12-04 (×3): qty 2

## 2016-12-04 MED ORDER — CLINDAMYCIN PHOSPHATE 900 MG/50ML IV SOLN: 900.0000 mg | Freq: Three times a day (TID) | INTRAVENOUS | Status: DC

## 2016-12-04 MED ORDER — ONDANSETRON HCL 4 MG/2ML IJ SOLN: 4.0000 mg | Freq: Four times a day (QID) | INTRAMUSCULAR | Status: DC | PRN

## 2016-12-04 MED ORDER — SODIUM CHLORIDE 0.9 % IV SOLN
2.0000 g | Freq: Four times a day (QID) | INTRAVENOUS | Status: DC
  Administered 2016-12-04 (×3): 2 g via INTRAVENOUS
  Filled 2016-12-04 (×5): qty 2000

## 2016-12-04 MED ORDER — CLINDAMYCIN PHOSPHATE 900 MG/50ML IV SOLN
900.0000 mg | INTRAVENOUS | Status: DC
  Administered 2016-12-04 (×2): 900 mg via INTRAVENOUS
  Filled 2016-12-04 (×2): qty 50

## 2016-12-04 MED ORDER — METRONIDAZOLE 500 MG PO TABS
500.0000 mg | ORAL_TABLET | Freq: Three times a day (TID) | ORAL | Status: DC
  Administered 2016-12-04 – 2016-12-06 (×6): 500 mg via ORAL
  Filled 2016-12-04 (×6): qty 1

## 2016-12-04 MED ORDER — PRENATAL MULTIVITAMIN CH
1.0000 | ORAL_TABLET | Freq: Every day | ORAL | Status: DC
  Administered 2016-12-04 – 2016-12-06 (×3): 1 via ORAL
  Filled 2016-12-04 (×3): qty 1

## 2016-12-04 MED ORDER — IBUPROFEN 800 MG PO TABS
800.0000 mg | ORAL_TABLET | Freq: Three times a day (TID) | ORAL | Status: DC | PRN
  Administered 2016-12-04 – 2016-12-06 (×4): 800 mg via ORAL
  Filled 2016-12-04 (×4): qty 1

## 2016-12-04 MED ORDER — ONDANSETRON HCL 4 MG PO TABS: 4.0000 mg | ORAL_TABLET | Freq: Four times a day (QID) | ORAL | Status: DC | PRN

## 2016-12-04 MED ORDER — OXYCODONE-ACETAMINOPHEN 5-325 MG PO TABS: 1.0000 | ORAL_TABLET | ORAL | Status: DC | PRN

## 2016-12-04 MED ORDER — LACTATED RINGERS IV SOLN
INTRAVENOUS | Status: DC
  Administered 2016-12-04 (×2): via INTRAVENOUS
  Administered 2016-12-04 – 2016-12-05 (×2): 125 mL/h via INTRAVENOUS
  Administered 2016-12-05: 10:00:00 via INTRAVENOUS

## 2016-12-04 MED ORDER — GENTAMICIN SULFATE 40 MG/ML IJ SOLN
INTRAVENOUS | Status: DC
  Administered 2016-12-04: 02:00:00 via INTRAVENOUS
  Filled 2016-12-04 (×2): qty 12.75

## 2016-12-04 MED ORDER — TRAMADOL HCL 50 MG PO TABS
50.0000 mg | ORAL_TABLET | ORAL | Status: DC | PRN
  Administered 2016-12-04 – 2016-12-06 (×5): 50 mg via ORAL
  Filled 2016-12-04 (×5): qty 1

## 2016-12-04 MED ORDER — DEXTROSE 5 % IV SOLN
2.0000 g | INTRAVENOUS | Status: DC
  Administered 2016-12-04 – 2016-12-05 (×2): 2 g via INTRAVENOUS
  Filled 2016-12-04 (×3): qty 2

## 2016-12-04 MED ORDER — IOPAMIDOL (ISOVUE-300) INJECTION 61%
100.0000 mL | Freq: Once | INTRAVENOUS | Status: AC | PRN
  Administered 2016-12-04: 100 mL via INTRAVENOUS

## 2016-12-04 NOTE — H&P (Signed)
History     CSN: [id number]  Arrival date and time: 12/03/16 2015   First Provider Initiated Contact with Patient 12/03/16 2132         Chief Complaint  Patient presents with  . Pelvic Pain   Pelvic Pain  The patient's primary symptoms include pelvic pain. This is a new problem. The current episode started today. The problem occurs constantly (constant fullness and pressure in lower abdomin and intermittent pain with urination. ). The problem has been gradually worsening. Pain severity now: 7/10 pain with urination. Back pain and lower abdominal pain about 3/10  The problem affects both (radiates to back. ) sides. She is not pregnant. Associated symptoms include abdominal pain, dysuria, frequency, nausea and urgency. Pertinent negatives include no chills, constipation, diarrhea, fever (highest 99.7 at home.) or vomiting. The symptoms are aggravated by urinating. She has tried oral narcotics (hot shower. ) for the symptoms. The treatment provided mild relief. Sexual activity: No intercourse since surgery  She uses hysterectomy for contraception. Her past medical history is significant for a gynecological surgery. (Hysterectomy on 11/26/16 )       Past Medical History:  Diagnosis Date  . Ankle fracture    left  . Asthma   . Migraine   . Spinal headache          Past Surgical History:  Procedure Laterality Date  . ANKLE FRACTURE SURGERY    . CESAREAN SECTION    . CYSTOSCOPY N/A 11/26/2016   Procedure: CYSTOSCOPY;  Surgeon: Ena Dawley, MD;  Location: [facility];  Service: Gynecology;  Laterality: N/A;  . HYSTERECTOMY ABDOMINAL WITH SALPINGECTOMY Bilateral 11/26/2016   Procedure: HYSTERECTOMY ABDOMINAL WITH SALPINGECTOMY, right ovarian biopsy;  Surgeon: Eldred Manges, MD;  Location: [facility];  Service: Gynecology;  Laterality: Bilateral;  . MYOMECTOMY      No family history on file.         Social History   Substance Use Topics   . Smoking status:  Never Smoker   . Smokeless tobacco: Never Used   . Alcohol use Yes     Comment: occ     Allergies:       Allergies  Allergen Reactions  . Ciprofloxacin Nausea Only    lightheaded           Prescriptions Prior to Admission  Medication Sig Dispense Refill Last Dose  . albuterol (PROVENTIL HFA;VENTOLIN HFA) 108 (90 BASE) MCG/ACT inhaler Inhale 2 puffs into the lungs every 6 (six) hours as needed for wheezing or shortness of breath.   Unknown at Unknown time  . DULERA 200-5 MCG/ACT AERO Inhale 2 puffs into the lungs 2 (two) times daily as needed for wheezing or shortness of breath.   3 Unknown at Unknown time  . eletriptan (RELPAX) 20 MG tablet Take 20 mg by mouth every 2 (two) hours as needed for migraine. may repeat in 2 hours if necessary    More than a month at Unknown time  . estradiol (VIVELLE-DOT) 0.1 MG/24HR patch 1 patch 2 (two) times a week.   11/26/2016 at Unknown time  . ibuprofen (ADVIL,MOTRIN) 600 MG tablet 1  po pc every 6 hours for 5 days then prn-pain 30 tablet 1   . loratadine (CLARITIN) 10 MG tablet Take 10 mg by mouth daily as needed for allergies.   11/25/2016 at Unknown time  . magnesium oxide (MAG-OX) 400 (241.3 Mg) MG tablet Take 1 tablet by mouth daily.  3 11/25/2016 at Unknown time  .  Melatonin 2.5 MG CHEW Chew 1 tablet by mouth at bedtime as needed (sleep).   Past Month at Unknown time  . ondansetron (ZOFRAN) 4 MG tablet Take 1 tablet (4 mg total) by mouth every 8 (eight) hours as needed for nausea or vomiting. 20 tablet 0   . oxyCODONE-acetaminophen (PERCOCET/ROXICET) 5-325 MG tablet Take 1-2 tablets by mouth every 6 (six) hours as needed for severe pain (moderate to severe pain (when tolerating fluids)). 30 tablet 0   . topiramate (TOPAMAX) 50 MG tablet Take 50 mg by mouth at bedtime.   11/25/2016 at Unknown time    Review of Systems  Constitutional: Negative for chills and fever (highest 99.7 at home.).  Gastrointestinal: Positive  for abdominal pain and nausea. Negative for constipation, diarrhea and vomiting.  Genitourinary: Positive for dysuria, frequency, pelvic pain and urgency.   Physical Exam   Blood pressure 119/71, pulse 94, temperature 100.9 F (38.3 C), temperature source Oral, resp. rate 18, last menstrual period 11/21/2016.  Physical Exam  Nursing note and vitals reviewed. Constitutional: She is oriented to person, place, and time. She appears well-developed and well-nourished. No distress.  HENT:  Head: Normocephalic.  Cardiovascular: Normal rate.   Respiratory: Effort normal.  GI: Soft. There is no tenderness. There is no rebound.  Genitourinary:  Genitourinary Comments:  External: no lesion Vagina: small amount of tan discharge Cervix: SA, cuff appears normal  Uterus: SA Adnexa: NT   Neurological: She is alert and oriented to person, place, and time.  Skin: Skin is warm and dry.  Psychiatric: She has a normal mood and affect.    Lab Results Last 24 Hours       Results for orders placed or performed during the hospital encounter of 12/03/16 (from the past 24 hour(s))  Urinalysis, Routine w reflex microscopic     Status: None   Collection Time: 12/03/16  9:00 PM  Result Value Ref Range   Color, Urine YELLOW YELLOW   APPearance CLEAR CLEAR   Specific Gravity, Urine 1.017 1.005 - 1.030   pH 5.0 5.0 - 8.0   Glucose, UA NEGATIVE NEGATIVE mg/dL   Hgb urine dipstick NEGATIVE NEGATIVE   Bilirubin Urine NEGATIVE NEGATIVE   Ketones, ur NEGATIVE NEGATIVE mg/dL   Protein, ur NEGATIVE NEGATIVE mg/dL   Nitrite NEGATIVE NEGATIVE   Leukocytes, UA NEGATIVE NEGATIVE  Pregnancy, urine POC     Status: None   Collection Time: 12/03/16  9:11 PM  Result Value Ref Range   Preg Test, Ur NEGATIVE NEGATIVE  CBC with Differential/Platelet     Status: Abnormal   Collection Time: 12/03/16 10:12 PM  Result Value Ref Range   WBC 10.7 (H) 4.0 - 10.5 K/uL   RBC 3.87 3.87 - 5.11 MIL/uL    Hemoglobin 10.2 (L) 12.0 - 15.0 g/dL   HCT 30.6 (L) 36.0 - 46.0 %   MCV 79.1 78.0 - 100.0 fL   MCH 26.4 26.0 - 34.0 pg   MCHC 33.3 30.0 - 36.0 g/dL   RDW 13.6 11.5 - 15.5 %   Platelets 414 (H) 150 - 400 K/uL   Neutrophils Relative % 77 %   Neutro Abs 8.2 (H) 1.7 - 7.7 K/uL   Lymphocytes Relative 16 %   Lymphs Abs 1.8 0.7 - 4.0 K/uL   Monocytes Relative 6 %   Monocytes Absolute 0.6 0.1 - 1.0 K/uL   Eosinophils Relative 1 %   Eosinophils Absolute 0.1 0.0 - 0.7 K/uL   Basophils Relative 0 %  Basophils Absolute 0.0 0.0 - 0.1 K/uL     US Pelvis Complete  Result Date: 12/03/2016 CLINICAL DATA:  Post hysterectomy and salpingectomy one right prior. Lower abdominal pressure ptotic. EXAM: TRANSABDOMINAL ULTRASOUND OF PELVIS TECHNIQUE: Transabdominal ultrasound examination of the pelvis was performed including evaluation of the uterus, ovaries, adnexal regions, and pelvic cul-de-sac. COMPARISON:  None. FINDINGS: Uterus Post hysterectomy.  No focal abnormality of the vaginal cuff. Right ovary Not visualized.  No adnexal mass. Left ovary Not visualized. No adnexal mass. Other findings: No abnormal free fluid. No evidence of pelvic fluid collection. IMPRESSION: Post hysterectomy. No sonographic evidence of pelvic fluid collection. Neither ovary was visualized. Technically limited exam. If further evaluation is warranted clinically, recommend CT. Electronically Signed   By: Jeb Levering M.D.   On: 12/03/2016 23:52   MAU Course  Procedures  MDM 2323: D/W Dr. Mancel Bale. Will get [place], and do speculum exam to evaluate the cuff. Call back with results.  0010: D.W Dr. Mancel Bale will admit patient  Assessment and Plan  Post-operative fever Dysuria Admit to GYN    See orders for management of care  Farrel Gordon, CNM 12/04/16, 12:30 AM

## 2016-12-04 NOTE — Progress Notes (Signed)
Pt receiving once daily gentamicin for post op fever/dysuria/possible wound infection.  SCr 0.74 with est. CrCl 99 ml/min.  Continue current gentamicin dosing.  Beryle Lathe 12/04/2016

## 2016-12-04 NOTE — Consult Note (Signed)
[facility]       Reason for Consult: fluid collection post op    Referring Physician: Dr. Leo Grosser  Active Problems:   Fever   . ampicillin (OMNIPEN) IV  2 g Intravenous Q6H  . clindamycin (CLEOCIN) IV  900 mg Intravenous 2 times per day  . gentamicin (GARAMYCIN) with clindamycin (CLEOCIN) IV   Intravenous Q24H  . prenatal multivitamin  1 tablet Oral Q1200  . topiramate  50 mg Oral QHS    Recommendations: I will streamline antibiotics to ceftriaxone and flagyl Continue to observe on antibiotics for improvement over the next 24-48 hours If worsening fever, WBC or other concerns, I would recommend aspiration   Assessment: She has a fluid collection post op associated with pain, fever, mildly elevated WBC.  Differential of collection could be pus, hematoma, infected hematoma or seroma.  Sampling would be the ideal method to identify but I do agree it is a reasonable approach to continue with above antibiotics for now, to see if she improves. I will then consider a short course of oral antibiotics at home (pending any positive cultures) and then can see how she does, +/- further imaging.  The fever can certainly be a result of a hematoma as well.   Her UA is negative for infection so most c/w the fluid collection causing discomfort rather than UTI.     I will continue to follow  Antibiotics: Ampicillin, gentamicin, clindamycin  HPI: [name] is a 50 y.o. female with recent TAH, bilateral salpingectomy with noted adhesions from previous C section about 1 week ago who had a relatively uneventful post op period, discharged on 11/30 but presented yesterday with discomfort with urination and back pain.  This came on yesterday, no associated movement, trauma.  No fever or chills at home but temperature of 101.2 here.  UA was clear and with pain a CT scan with contrast was done and noted a fluid collection within the cul-de-sac.  Differential is infection,  hematoma, seroma.  She was placed on empiric antibiotics and blood cultures sent.   CT independently reviewed and fluid collection noted.    Review of Systems:  Constitutional: negative for chills and anorexia Gastrointestinal: negative for diarrhea; did have some nausea with percocet but resolved off of it Genitourinary: positive for dysuria, negative for frequency, urinary incontinence and hematuria Integument/breast: negative for rash All other systems reviewed and are negative    Past Medical History:  Diagnosis Date  . Ankle fracture    left  . Asthma   . Migraine   . Spinal headache     Social History  Substance Use Topics  . Smoking status: Never Smoker  . Smokeless tobacco: Never Used  . Alcohol use Yes     Comment: occ    History reviewed. No pertinent family history.  Allergies  Allergen Reactions  . Ciprofloxacin Nausea Only    lightheaded    Physical Exam: Constitutional: in no apparent distress and alert  Vitals:   12/04/16 0953 12/04/16 1400  BP: 110/66 104/64  Pulse: 92 84  Resp: 18 18  Temp: 97.7 F (36.5 C) 98.4 F (36.9 C)   EYES: anicteric ENMT: no thrush Cardiovascular: Cor RRR Respiratory: CTA B; normal respiratory effort GI: Bowel sounds are normal, liver is not enlarged, spleen is not enlarged Musculoskeletal: no pedal edema noted Skin: negatives: no rash Neuro: non focal  Lab Results  Component Value Date   WBC 11.8 (H) 12/04/2016  HGB 9.4 (L) 12/04/2016   HCT 28.1 (L) 12/04/2016   MCV 77.6 (L) 12/04/2016   PLT 392 12/04/2016    Lab Results  Component Value Date   CREATININE 0.74 12/04/2016   No results found for: ALT, AST, GGT, ALKPHOS   Microbiology: Recent Results (from the past 240 hour(s))  Wet prep, genital     Status: Abnormal   Collection Time: 12/03/16 11:56 PM  Result Value Ref Range Status   Yeast Wet Prep HPF POC NONE SEEN NONE SEEN Final   Trich, Wet Prep NONE SEEN NONE SEEN Final   Clue Cells Wet Prep  HPF POC NONE SEEN NONE SEEN Final   WBC, Wet Prep HPF POC FEW (A) NONE SEEN Final    Comment: MANY BACTERIA SEEN   Sperm NONE SEEN  Final    Porshia Blizzard, Herbie [place], [facility] [email or web address] [phone] pager  [phone] cell 12/04/2016, 6:23 PM

## 2016-12-04 NOTE — Progress Notes (Signed)
Late entry.  Pt seen at 230 pm and CT ordered.   Subjective: Patient reports no nausea, or vomiting, though appetite has been decreased. She denies incisional pai.  Her only pain is pelvic pain and only with urination. She is  tolerating PO, + flatus, + BM.    Objective: I have reviewed patient's vital signs, intake and output, medications, labs, pathology and radiology results.  General: alert, cooperative and no distress Resp: clear to auscultation bilaterally Cardio: regular rate and rhythm, S1, S2 normal, no murmur, click, rub or gallop GI: soft, non-tender; bowel sounds normal; no masses,  no organomegaly.  Incision healing well. Extremities: extremities normal, atraumatic, no cyanosis or edema Vaginal Bleeding: none  CT with 6cm fluid collection posterior to bladder Assessment/Plan: Post op fever and pelvic pain with fluid collection on CT. Continue antibiotics for 48 hours and if improved consider discharge on oral antibiotic. If no improvement by then, will consider aspiration ID consult called.  LOS: 0 days    Jacquez Sheetz P 12/04/2016, 11:21 PM

## 2016-12-04 NOTE — Progress Notes (Signed)
*   No surgery found *  Subjective: Patient reports tolerating PO, + flatus and + BM.    Objective: I have reviewed patient's vital signs, intake and output and medications.  General: alert and cooperative Resp: clear to auscultation bilaterally Cardio: regular rate and rhythm GI: soft, non-tender; bowel sounds normal; no masses,  no organomegaly Extremities: extremities normal, atraumatic, no cyanosis or edema Vaginal Bleeding: minimal  Assessment: s/p * No surgery found *: stable  Pt s/p TAH with fever and dysuria  Plan: Advance diet Continue ABX therapy due to Post-op infection  Cultures are pending US wnl no collection seen  LOS: 0 days    [name] A 12/04/2016, 11:51 AM

## 2016-12-04 NOTE — Progress Notes (Signed)
Pharmacy Antibiotic Note  [name] is a 50 y.o. female admitted on 12/03/2016 with post op fever/dysuria/possible wound infection.  Pharmacy has been consulted for Gentamicin dosing. Pt is s/p abdominal hysterectomy w/ salpingectomy on 11/29. Plan: Gentamicin 510mg  (7mg /kg) IV q24h. SCr in am.  Height: 5\' 5"  (165.1 cm) Weight: 212 lb (96.2 kg) IBW/kg (Calculated) : 57  Temp (24hrs), Avg:100.8 F (38.2 C), Min:100.2 F (37.9 C), Max:101.2 F (38.4 C)   Recent Labs Lab 11/27/16 0605 12/03/16 2212  WBC 10.4 10.7*    CrCl cannot be calculated (No order found.).    Allergies  Allergen Reactions  . Ciprofloxacin Nausea Only    lightheaded    Antimicrobials this admission: Ampicillin 2 gram IV q6h 12/7 >> Clindamycin 900mg  IV q8h  12/7 >>  Dose adjustments this admission:   Microbiology results:   Thank you for allowing pharmacy to be a part of this patient's care.  Vernie Ammons 12/04/2016 1:39 AM

## 2016-12-05 ENCOUNTER — Encounter (HOSPITAL_COMMUNITY): Payer: Self-pay

## 2016-12-05 DIAGNOSIS — R5082 Postprocedural fever: Secondary | ICD-10-CM | POA: Diagnosis present

## 2016-12-05 LAB — CBC WITH DIFFERENTIAL/PLATELET
BASOS PCT: 0 %
Basophils Absolute: 0 10*3/uL (ref 0.0–0.1)
Eosinophils Absolute: 0.2 10*3/uL (ref 0.0–0.7)
Eosinophils Relative: 1 %
HEMATOCRIT: 25.2 % — AB (ref 36.0–46.0)
HEMOGLOBIN: 8.7 g/dL — AB (ref 12.0–15.0)
LYMPHS ABS: 1.9 10*3/uL (ref 0.7–4.0)
Lymphocytes Relative: 15 %
MCH: 26.6 pg (ref 26.0–34.0)
MCHC: 34.5 g/dL (ref 30.0–36.0)
MCV: 77.1 fL — AB (ref 78.0–100.0)
MONO ABS: 1 10*3/uL (ref 0.1–1.0)
MONOS PCT: 8 %
NEUTROS ABS: 10 10*3/uL — AB (ref 1.7–7.7)
Neutrophils Relative %: 77 %
Platelets: 387 10*3/uL (ref 150–400)
RBC: 3.27 MIL/uL — ABNORMAL LOW (ref 3.87–5.11)
RDW: 13.6 % (ref 11.5–15.5)
WBC: 13 10*3/uL — ABNORMAL HIGH (ref 4.0–10.5)

## 2016-12-05 LAB — URINE CULTURE
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
## 2016-12-05 NOTE — Progress Notes (Signed)
POD#9 s/p TAH BILATERAL SALPINGECTOMY, CYSTOSCOPY  Subjective: Patient reports no  Nausea or  Vomiting. No real  incisional pain now.  Took only one dose tramadol yesterday. Is tolerating PO, Passing flatus. Daily BM. Has no problems voiding but is voiding frequently due to IV.  States pain with voiding may be slightly improved, but pressure in pelvis is increased because of bladder filling. Ambulating without difficulty.   Objective: I have reviewed patient's vital signs, intake and output, medications, labs, microbiology, pathology and radiology results.  General: alert, cooperative and no distress Resp: clear to auscultation bilaterally Cardio: regular rate and rhythm, S1, S2 normal, no murmur, click, rub or gallop GI: soft, non-tender; bowel sounds normal; no masses,  no organomegaly Extremities: extremities normal, atraumatic, no cyanosis or edema Vaginal Bleeding: none Incision well healed. Results for orders placed or performed during the hospital encounter of 12/03/16 (from the past 72 hour(s))  Urinalysis, Routine w reflex microscopic     Status: None   Collection Time: 12/03/16  9:00 PM  Result Value Ref Range   Color, Urine YELLOW YELLOW   APPearance CLEAR CLEAR   Specific Gravity, Urine 1.017 1.005 - 1.030   pH 5.0 5.0 - 8.0   Glucose, UA NEGATIVE NEGATIVE mg/dL   Hgb urine dipstick NEGATIVE NEGATIVE   Bilirubin Urine NEGATIVE NEGATIVE   Ketones, ur NEGATIVE NEGATIVE mg/dL   Protein, ur NEGATIVE NEGATIVE mg/dL   Nitrite NEGATIVE NEGATIVE   Leukocytes, UA NEGATIVE NEGATIVE  Urine culture     Status: Abnormal   Collection Time: 12/03/16  9:00 PM  Result Value Ref Range   Specimen Description URINE, CLEAN CATCH    Special Requests NONE    Culture MULTIPLE SPECIES PRESENT, SUGGEST RECOLLECTION (A)    Report Status 12/05/2016 FINAL   Pregnancy, urine POC     Status: None   Collection Time: 12/03/16  9:11 PM  Result Value Ref Range   Preg Test, Ur NEGATIVE  NEGATIVE    Comment:        THE SENSITIVITY OF THIS METHODOLOGY IS >24 mIU/mL   CBC with Differential/Platelet     Status: Abnormal   Collection Time: 12/03/16 10:12 PM  Result Value Ref Range   WBC 10.7 (H) 4.0 - 10.5 K/uL   RBC 3.87 3.87 - 5.11 MIL/uL   Hemoglobin 10.2 (L) 12.0 - 15.0 g/dL   HCT 30.6 (L) 36.0 - 46.0 %   MCV 79.1 78.0 - 100.0 fL   MCH 26.4 26.0 - 34.0 pg   MCHC 33.3 30.0 - 36.0 g/dL   RDW 13.6 11.5 - 15.5 %   Platelets 414 (H) 150 - 400 K/uL   Neutrophils Relative % 77 %   Neutro Abs 8.2 (H) 1.7 - 7.7 K/uL   Lymphocytes Relative 16 %   Lymphs Abs 1.8 0.7 - 4.0 K/uL   Monocytes Relative 6 %   Monocytes Absolute 0.6 0.1 - 1.0 K/uL   Eosinophils Relative 1 %   Eosinophils Absolute 0.1 0.0 - 0.7 K/uL   Basophils Relative 0 %   Basophils Absolute 0.0 0.0 - 0.1 K/uL  Wet prep, genital     Status: Abnormal   Collection Time: 12/03/16 11:56 PM  Result Value Ref Range   Yeast Wet Prep HPF POC NONE SEEN NONE SEEN   Trich, Wet Prep NONE SEEN NONE SEEN   Clue Cells Wet Prep HPF POC NONE SEEN NONE SEEN   WBC, Wet Prep HPF POC FEW (A) NONE SEEN  Comment: MANY BACTERIA SEEN   Sperm NONE SEEN   Culture, blood (routine x 2)     Status: None (Preliminary result)   Collection Time: 12/04/16  1:01 AM  Result Value Ref Range   Specimen Description BLOOD LEFT ARM    Special Requests BOTTLES DRAWN AEROBIC AND ANAEROBIC 5CC    Culture      NO GROWTH 1 DAY Performed at [facility]    Report Status PENDING   Culture, blood (routine x 2)     Status: None (Preliminary result)   Collection Time: 12/04/16  1:10 AM  Result Value Ref Range   Specimen Description BLOOD LEFT HAND    Special Requests IN PEDIATRIC BOTTLE 3.5    Culture      NO GROWTH 1 DAY Performed at [facility]    Report Status PENDING   CBC WITH DIFFERENTIAL     Status: Abnormal   Collection Time: 12/04/16  5:35 AM  Result Value Ref Range   WBC 11.8 (H) 4.0 - 10.5 K/uL   RBC 3.62 (L)  3.87 - 5.11 MIL/uL   Hemoglobin 9.4 (L) 12.0 - 15.0 g/dL   HCT 28.1 (L) 36.0 - 46.0 %   MCV 77.6 (L) 78.0 - 100.0 fL   MCH 26.0 26.0 - 34.0 pg   MCHC 33.5 30.0 - 36.0 g/dL   RDW 13.6 11.5 - 15.5 %   Platelets 392 150 - 400 K/uL   Neutrophils Relative % 73 %   Neutro Abs 8.6 (H) 1.7 - 7.7 K/uL   Lymphocytes Relative 20 %   Lymphs Abs 2.3 0.7 - 4.0 K/uL   Monocytes Relative 6 %   Monocytes Absolute 0.7 0.1 - 1.0 K/uL   Eosinophils Relative 1 %   Eosinophils Absolute 0.1 0.0 - 0.7 K/uL   Basophils Relative 0 %   Basophils Absolute 0.0 0.0 - 0.1 K/uL  Creatinine, serum     Status: None   Collection Time: 12/04/16  5:35 AM  Result Value Ref Range   Creatinine, Ser 0.74 0.44 - 1.00 mg/dL   GFR calc non Af Amer >60 >60 mL/min   GFR calc Af Amer >60 >60 mL/min    Comment: (NOTE) The eGFR has been calculated using the CKD EPI equation. This calculation has not been validated in all clinical situations. eGFR's persistently <60 mL/min signify possible Chronic Kidney Disease.   CBC with Differential     Status: Abnormal   Collection Time: 12/05/16  5:44 AM  Result Value Ref Range   WBC 13.0 (H) 4.0 - 10.5 K/uL   RBC 3.27 (L) 3.87 - 5.11 MIL/uL   Hemoglobin 8.7 (L) 12.0 - 15.0 g/dL   HCT 25.2 (L) 36.0 - 46.0 %   MCV 77.1 (L) 78.0 - 100.0 fL   MCH 26.6 26.0 - 34.0 pg   MCHC 34.5 30.0 - 36.0 g/dL   RDW 13.6 11.5 - 15.5 %   Platelets 387 150 - 400 K/uL   Neutrophils Relative % 77 %   Neutro Abs 10.0 (H) 1.7 - 7.7 K/uL   Lymphocytes Relative 15 %   Lymphs Abs 1.9 0.7 - 4.0 K/uL   Monocytes Relative 8 %   Monocytes Absolute 1.0 0.1 - 1.0 K/uL   Eosinophils Relative 1 %   Eosinophils Absolute 0.2 0.0 - 0.7 K/uL   Basophils Relative 0 %   Basophils Absolute 0.0 0.0 - 0.1 K/uL    Assessment Post op fever  Pelvic fluid collection :?  Hematoma, ?abscess Slight increase in WBC Hgb decrease consistent with hydration. Sx probably stable.  LOS: 1 day    Plan Consult from Dr. Linus Salmons  appreciated Will continue Rocephin and metronidazole per his recommendation. Saline lock IV. Reassess daily for change in status to determine need for percutaneous drainage.   HAYGOOD,VANESSA P 12/05/2016, 10:58 AM

## 2016-12-05 NOTE — Progress Notes (Signed)
    [facility] for Infectious Disease   Reason for visit: Follow up on fluid collection post op  Interval History: feels about the same, WBC up just a bit, no fever, no chills, ambulating  Physical Exam: Constitutional:  Vitals:   12/05/16 1302 12/05/16 1610  BP: (!) 108/59 101/66  Pulse: 90 98  Resp: 18 18  Temp: 98.3 F (36.8 C) 99.2 F (37.3 C)   patient appears in NAD GI: soft, nt Neuro: non focal  Review of Systems: Constitutional: negative for fevers and chills Gastrointestinal: negative for diarrhea  Lab Results  Component Value Date   WBC 13.0 (H) 12/05/2016   HGB 8.7 (L) 12/05/2016   HCT 25.2 (L) 12/05/2016   MCV 77.1 (L) 12/05/2016   PLT 387 12/05/2016    Lab Results  Component Value Date   CREATININE 0.74 12/04/2016   No results found for: ALT, AST, GGT, ALKPHOS   Microbiology: Recent Results (from the past 240 hour(s))  Urine culture     Status: Abnormal   Collection Time: 12/03/16  9:00 PM  Result Value Ref Range Status   Specimen Description URINE, CLEAN CATCH  Final   Special Requests NONE  Final   Culture MULTIPLE SPECIES PRESENT, SUGGEST RECOLLECTION (A)  Final   Report Status 12/05/2016 FINAL  Final  Wet prep, genital     Status: Abnormal   Collection Time: 12/03/16 11:56 PM  Result Value Ref Range Status   Yeast Wet Prep HPF POC NONE SEEN NONE SEEN Final   Trich, Wet Prep NONE SEEN NONE SEEN Final   Clue Cells Wet Prep HPF POC NONE SEEN NONE SEEN Final   WBC, Wet Prep HPF POC FEW (A) NONE SEEN Final    Comment: MANY BACTERIA SEEN   Sperm NONE SEEN  Final  Culture, blood (routine x 2)     Status: None (Preliminary result)   Collection Time: 12/04/16  1:01 AM  Result Value Ref Range Status   Specimen Description BLOOD LEFT ARM  Final   Special Requests BOTTLES DRAWN AEROBIC AND ANAEROBIC 5CC  Final   Culture   Final    NO GROWTH 1 DAY Performed at [facility]    Report Status PENDING  Incomplete  Culture, blood (routine x  2)     Status: None (Preliminary result)   Collection Time: 12/04/16  1:10 AM  Result Value Ref Range Status   Specimen Description BLOOD LEFT HAND  Final   Special Requests IN PEDIATRIC BOTTLE 3.5  Final   Culture   Final    NO GROWTH 1 DAY Performed at [facility]    Report Status PENDING  Incomplete    Impression/Plan:  1. Fluid collection - seems stable.  WBC up some but not much.  Continue antibiotics.   2. Pain - same and controlled.    Dr. Megan Salon will follow up tomorrow.

## 2016-12-06 DIAGNOSIS — R5082 Postprocedural fever: Principal | ICD-10-CM

## 2016-12-06 DIAGNOSIS — F1099 Alcohol use, unspecified with unspecified alcohol-induced disorder: Secondary | ICD-10-CM

## 2016-12-06 DIAGNOSIS — D508 Other iron deficiency anemias: Secondary | ICD-10-CM

## 2016-12-06 DIAGNOSIS — D500 Iron deficiency anemia secondary to blood loss (chronic): Secondary | ICD-10-CM | POA: Diagnosis present

## 2016-12-06 LAB — CBC WITH DIFFERENTIAL/PLATELET
BASOS ABS: 0 10*3/uL (ref 0.0–0.1)
BASOS PCT: 0 %
BASOS PCT: 0 %
Basophils Absolute: 0 10*3/uL (ref 0.0–0.1)
EOS ABS: 0.3 10*3/uL (ref 0.0–0.7)
EOS PCT: 2 %
EOS PCT: 3 %
Eosinophils Absolute: 0.4 10*3/uL (ref 0.0–0.7)
HEMATOCRIT: 23.8 % — AB (ref 36.0–46.0)
HEMATOCRIT: 25.2 % — AB (ref 36.0–46.0)
Hemoglobin: 8 g/dL — ABNORMAL LOW (ref 12.0–15.0)
Hemoglobin: 8.6 g/dL — ABNORMAL LOW (ref 12.0–15.0)
LYMPHS PCT: 15 %
Lymphocytes Relative: 19 %
Lymphs Abs: 2.3 10*3/uL (ref 0.7–4.0)
Lymphs Abs: 1.9 10*3/uL (ref 0.7–4.0)
MCH: 25.7 pg — ABNORMAL LOW (ref 26.0–34.0)
MCH: 26.3 pg (ref 26.0–34.0)
MCHC: 33.6 g/dL (ref 30.0–36.0)
MCHC: 34.1 g/dL (ref 30.0–36.0)
MCV: 76.5 fL — ABNORMAL LOW (ref 78.0–100.0)
MCV: 77.1 fL — AB (ref 78.0–100.0)
MONO ABS: 0.7 10*3/uL (ref 0.1–1.0)
Monocytes Absolute: 0.5 10*3/uL (ref 0.1–1.0)
Monocytes Relative: 6 %
Monocytes Relative: 4 %
NEUTROS ABS: 8.6 10*3/uL — AB (ref 1.7–7.7)
NEUTROS ABS: 9.6 10*3/uL — AB (ref 1.7–7.7)
NEUTROS PCT: 78 %
Neutrophils Relative %: 72 %
OTHER: 0 %
PLATELETS: 384 10*3/uL (ref 150–400)
Platelets: 418 10*3/uL — ABNORMAL HIGH (ref 150–400)
RBC: 3.11 MIL/uL — ABNORMAL LOW (ref 3.87–5.11)
RBC: 3.27 MIL/uL — ABNORMAL LOW (ref 3.87–5.11)
RDW: 13.9 % (ref 11.5–15.5)
RDW: 13.8 % (ref 11.5–15.5)
WBC: 11.9 10*3/uL — ABNORMAL HIGH (ref 4.0–10.5)
WBC: 12.4 10*3/uL — AB (ref 4.0–10.5)

## 2016-12-06 MED ORDER — PRENATAL MULTIVITAMIN CH: 1.0000 | ORAL_TABLET | Freq: Every day | ORAL | 12 refills | Status: DC

## 2016-12-06 MED ORDER — FERROUS SULFATE 325 (65 FE) MG PO TBEC: 325.0000 mg | DELAYED_RELEASE_TABLET | Freq: Two times a day (BID) | ORAL | 3 refills | Status: DC

## 2016-12-06 MED ORDER — AMOXICILLIN-POT CLAVULANATE 875-125 MG PO TABS
1.0000 | ORAL_TABLET | Freq: Two times a day (BID) | ORAL | Status: DC
  Administered 2016-12-06: 1 via ORAL
  Filled 2016-12-06: qty 1

## 2016-12-06 MED ORDER — AMOXICILLIN-POT CLAVULANATE 875-125 MG PO TABS: 1.0000 | ORAL_TABLET | Freq: Two times a day (BID) | ORAL | 0 refills | Status: DC

## 2016-12-06 MED ORDER — DOCUSATE SODIUM 100 MG PO CAPS: 100.0000 mg | ORAL_CAPSULE | Freq: Two times a day (BID) | ORAL | 0 refills | Status: DC | PRN

## 2016-12-06 NOTE — Progress Notes (Signed)
[facility] for Infectious Disease  Date of Admission:  12/03/2016   Total days of antibiotics 4        Day 2 ceftriaxone        Day 2 metronidazole         Active Problems:   Fever   Fever postop   Iron deficiency anemia due to chronic blood loss   . cefTRIAXone (ROCEPHIN)  IV  2 g Intravenous Q24H  . metroNIDAZOLE  500 mg Oral Q8H  . prenatal multivitamin  1 tablet Oral Q1200  . topiramate  50 mg Oral QHS    SUBJECTIVE: She is feeling much better today. She had some sweats last night but is not having any more fevers. She has more energy and her appetite has improved. The pressure type pain in her lower abdomen has resolved. She is no longer having any burning at the very end of her urine stream.  Review of Systems: Review of Systems  Constitutional: Positive for diaphoresis and malaise/fatigue. Negative for chills, fever and weight loss.  HENT: Negative for sore throat.   Respiratory: Negative for cough, sputum production and shortness of breath.   Cardiovascular: Negative for chest pain.  Gastrointestinal: Negative for abdominal pain, diarrhea, heartburn, nausea and vomiting.  Genitourinary: Negative for dysuria and frequency.  Musculoskeletal: Negative for joint pain and myalgias.  Skin: Negative for rash.  Neurological: Negative for dizziness and headaches.    Past Medical History:  Diagnosis Date  . Ankle fracture    left  . Asthma   . Migraine   . Spinal headache     Social History  Substance Use Topics  . Smoking status: Never Smoker  . Smokeless tobacco: Never Used  . Alcohol use Yes     Comment: occ    History reviewed. No pertinent family history. Allergies  Allergen Reactions  . Ciprofloxacin Nausea Only    lightheaded    OBJECTIVE: Vitals:   12/06/16 0800 12/06/16 1100 12/06/16 1105 12/06/16 1112  BP: 99/61 112/63 113/63 114/63  Pulse: 83 85 85 87  Resp: 18     Temp: 98.3 F (36.8 C)     TempSrc: Oral     SpO2: 100%       Weight:      Height:       Body mass index is 35.28 kg/m.  Physical Exam  Constitutional: She is oriented to person, place, and time.  She is comfortable and in good spirits sitting up in a chair.  HENT:  Mouth/Throat: No oropharyngeal exudate.  Eyes: Conjunctivae are normal.  Cardiovascular: Normal rate and regular rhythm.   No murmur heard. Pulmonary/Chest: Effort normal and breath sounds normal. She has no wheezes. She has no rales.  Abdominal: Soft. She exhibits no mass. There is no tenderness.  Musculoskeletal: Normal range of motion.  Neurological: She is alert and oriented to person, place, and time.  Skin: No rash noted.  Psychiatric: Mood and affect normal.    Lab Results Lab Results  Component Value Date   WBC 11.9 (H) 12/06/2016   HGB 8.0 (L) 12/06/2016   HCT 23.8 (L) 12/06/2016   MCV 76.5 (L) 12/06/2016   PLT 384 12/06/2016    Lab Results  Component Value Date   CREATININE 0.74 12/04/2016   No results found for: ALT, AST, GGT, ALKPHOS, BILITOT   Microbiology: Recent Results (from the past 240 hour(s))  Urine culture     Status: Abnormal  Collection Time: 12/03/16  9:00 PM  Result Value Ref Range Status   Specimen Description URINE, CLEAN CATCH  Final   Special Requests NONE  Final   Culture MULTIPLE SPECIES PRESENT, SUGGEST RECOLLECTION (A)  Final   Report Status 12/05/2016 FINAL  Final  Wet prep, genital     Status: Abnormal   Collection Time: 12/03/16 11:56 PM  Result Value Ref Range Status   Yeast Wet Prep HPF POC NONE SEEN NONE SEEN Final   Trich, Wet Prep NONE SEEN NONE SEEN Final   Clue Cells Wet Prep HPF POC NONE SEEN NONE SEEN Final   WBC, Wet Prep HPF POC FEW (A) NONE SEEN Final    Comment: MANY BACTERIA SEEN   Sperm NONE SEEN  Final  Culture, blood (routine x 2)     Status: None (Preliminary result)   Collection Time: 12/04/16  1:01 AM  Result Value Ref Range Status   Specimen Description BLOOD LEFT ARM  Final   Special Requests  BOTTLES DRAWN AEROBIC AND ANAEROBIC 5CC  Final   Culture   Final    NO GROWTH 1 DAY Performed at [facility]    Report Status PENDING  Incomplete  Culture, blood (routine x 2)     Status: None (Preliminary result)   Collection Time: 12/04/16  1:10 AM  Result Value Ref Range Status   Specimen Description BLOOD LEFT HAND  Final   Special Requests IN PEDIATRIC BOTTLE 3.5  Final   Culture   Final    NO GROWTH 1 DAY Performed at [facility]    Report Status PENDING  Incomplete     ASSESSMENT: She is improving on empiric therapy for postoperative fever and possible pelvic abscess. I would feel comfortable discharging her home today on oral Augmentin. I would plan on repeating a pelvic CT scan in about 10 days to help guide optimal duration of antibiotic therapy.  PLAN: 1. Discharged on oral Augmentin 2. Consider repeat CT scan in 10 days 3. Please call us if we can be of further assistance  Michel Bickers, MD [facility] for [place] [phone] pager   [phone] cell 12/06/2016, 12:42 PMPatient ID: Atilano Ina, female   DOB: March 25, 1966, 50 y.o.   MRN: [id number]

## 2016-12-06 NOTE — Discharge Instructions (Signed)
Call for temperature >100.4 or pain not relieved by prescribed pain medication. Continue instructions give on discharge 11/27/16

## 2016-12-06 NOTE — Progress Notes (Signed)
POD#10 HD#3  Subjective: Patient reports  No nause or, vomiting.   Incisional pain well managed with ibuprofen and tramadol.   Tolerating PO well.   + BM and No problems voiding.  Less pelvic pressure without IV fluids.  Denies dizziness with ambulation  Objective:  Temp:  [97.8 F (36.6 C)-99.5 F (37.5 C)] 98.3 F (36.8 C) (12/09 0800) Pulse Rate:  [77-98] 83 (12/09 0800) Resp:  [16-18] 18 (12/09 0800) BP: (98-113)/(52-66) 99/61 (12/09 0800) SpO2:  [100 %] 100 % (12/09 0800) I have reviewed patient's vital signs, intake and output, medications, labs, microbiology and radiology results.  General: alert, cooperative and no distress  No vaginal bleeding  Results for orders placed or performed during the hospital encounter of 12/03/16 (from the past 24 hour(s))  CBC with Differential     Status: Abnormal   Collection Time: 12/06/16  5:21 AM  Result Value Ref Range   WBC 11.9 (H) 4.0 - 10.5 K/uL   RBC 3.11 (L) 3.87 - 5.11 MIL/uL   Hemoglobin 8.0 (L) 12.0 - 15.0 g/dL   HCT 23.8 (L) 36.0 - 46.0 %   MCV 76.5 (L) 78.0 - 100.0 fL   MCH 25.7 (L) 26.0 - 34.0 pg   MCHC 33.6 30.0 - 36.0 g/dL   RDW 13.9 11.5 - 15.5 %   Platelets 384 150 - 400 K/uL   Neutrophils Relative % 72 %   Neutro Abs 8.6 (H) 1.7 - 7.7 K/uL   Lymphocytes Relative 19 %   Lymphs Abs 2.3 0.7 - 4.0 K/uL   Monocytes Relative 6 %   Monocytes Absolute 0.7 0.1 - 1.0 K/uL   Eosinophils Relative 2 %   Eosinophils Absolute 0.3 0.0 - 0.7 K/uL   Basophils Relative 0 %   Basophils Absolute 0.0 0.0 - 0.1 K/uL     Assessment Improved sx and WBC.  Afebrile Hgb decrease without other signs of active bleeding, pt tolerating anemia without sx   PLAN: Recheck labs in am.  Pt wants to go home. Will confer with ID MD re parameters for discharge.  LOS: 2 days    Matai Carpenito P 12/06/2016, 10:45 AM

## 2016-12-06 NOTE — Progress Notes (Signed)
Subjective: Patient reports feeling well with less pelvic pain, no vaginal bleeding or discharge and no dizziness.    Objective: I have reviewed patient's vital signs, intake and output, medications, labs and microbiology.  BP 116/64   Pulse 80   Temp 97.7 F (36.5 C) (Oral)   Resp 18   Ht 5\' 5"  (1.651 m)   Wt 212 lb (96.2 kg)   LMP 11/21/2016 (Exact Date)   SpO2 100%   BMI 35.28 kg/m   Results for orders placed or performed during the hospital encounter of 12/03/16 (from the past 24 hour(s))  CBC with Differential     Status: Abnormal   Collection Time: 12/06/16  5:21 AM  Result Value Ref Range   WBC 11.9 (H) 4.0 - 10.5 K/uL   RBC 3.11 (L) 3.87 - 5.11 MIL/uL   Hemoglobin 8.0 (L) 12.0 - 15.0 g/dL   HCT 23.8 (L) 36.0 - 46.0 %   MCV 76.5 (L) 78.0 - 100.0 fL   MCH 25.7 (L) 26.0 - 34.0 pg   MCHC 33.6 30.0 - 36.0 g/dL   RDW 13.9 11.5 - 15.5 %   Platelets 384 150 - 400 K/uL   Neutrophils Relative % 72 %   Neutro Abs 8.6 (H) 1.7 - 7.7 K/uL   Lymphocytes Relative 19 %   Lymphs Abs 2.3 0.7 - 4.0 K/uL   Monocytes Relative 6 %   Monocytes Absolute 0.7 0.1 - 1.0 K/uL   Eosinophils Relative 2 %   Eosinophils Absolute 0.3 0.0 - 0.7 K/uL   Basophils Relative 0 %   Basophils Absolute 0.0 0.0 - 0.1 K/uL  CBC with Differential     Status: Abnormal   Collection Time: 12/06/16  4:32 PM  Result Value Ref Range   WBC 12.4 (H) 4.0 - 10.5 K/uL   RBC 3.27 (L) 3.87 - 5.11 MIL/uL   Hemoglobin 8.6 (L) 12.0 - 15.0 g/dL   HCT 25.2 (L) 36.0 - 46.0 %   MCV 77.1 (L) 78.0 - 100.0 fL   MCH 26.3 26.0 - 34.0 pg   MCHC 34.1 30.0 - 36.0 g/dL   RDW 13.8 11.5 - 15.5 %   Platelets 418 (H) 150 - 400 K/uL   Neutrophils Relative % 78 %   Lymphocytes Relative 15 %   Monocytes Relative 4 %   Eosinophils Relative 3 %   Basophils Relative 0 %   Other 0 %   Neutro Abs 9.6 (H) 1.7 - 7.7 K/uL   Lymphs Abs 1.9 0.7 - 4.0 K/uL   Monocytes Absolute 0.5 0.1 - 1.0 K/uL   Eosinophils Absolute 0.4 0.0 - 0.7 K/uL    Basophils Absolute 0.0 0.0 - 0.1 K/uL    Assessment/Plan: WBC and hgb essentially stable. Pt wants to go home.  Reviewed labs with IM MD and he agrees. Has tolerated first dose of Augmentin  LOS: 2 days   Plan: Discharge home on Augmentin for 10 days. RTO 12/08/16 for cbc and 1 week for f/u  Serria Sloma P 12/06/2016, 7:11 PM

## 2016-12-06 NOTE — Discharge Summary (Signed)
Physician Discharge Summary  Patient ID: [name] MRN: [id number] DOB/AGE: 06-25-1966 50 y.o.  Admit date: 12/03/2016 Discharge date: 12/06/2016  Admission Diagnoses:Post op pelvic pain       Post op fever   Discharge Diagnoses:  Active Problems:   Fever   Fever postop   Iron deficiency anemia due to chronic blood loss Fluid collection in the pelvis ? Hematoma vs abscess  Discharged Condition: improved and stable  Hospital Course: The patient was admitted after evaluation in MAU for pelvic pain unresponsive to her post operative pain regimen and pressure with urination  Consults: ID  Significant Diagnostic Studies: labs: CBC, microbiology: blood culture: negative and urine culture: negative and radiology: CT scan: inflammatory changes in the pelvis with collections of fluid, especially in the [place] where a  6cm collection exists, as well as free fluid which extends to the pericolic gutters.  Treatments: IV hydration, antibiotics: amp/gent/clinda for 24 hours; rocephin for 48 hours;  and analgesia: Ibuprofen and tramadol with good relief  Discharge Exam: Blood pressure 116/64, pulse 80, temperature 97.7 F (36.5 C), temperature source Oral, resp. rate 18, height 5\' 5"  (1.651 m), weight 212 lb (96.2 kg), last menstrual period 11/21/2016, SpO2 100 %. General appearance: alert, cooperative and no distress Resp: clear to auscultation bilaterally Cardio: regular rate and rhythm, S1, S2 normal, no murmur, click, rub or gallop GI: soft, non-tender; bowel sounds normal; no masses,  no organomegaly Pelvic: deferred Extremities: extremities normal, atraumatic, no cyanosis or edema Pulses: 2+ and symmetric Skin: Skin color, texture, turgor normal. No rashes or lesions Incision/Wound:Well healed and well approximated without erythema or induration.  No draainage.  Disposition: 01-Home or Self Care     Medication List    TAKE these medications   albuterol 108 (90 Base) MCG/ACT  inhaler Commonly known as:  PROVENTIL HFA;VENTOLIN HFA Inhale 2 puffs into the lungs every 6 (six) hours as needed for wheezing or shortness of breath.   amoxicillin-clavulanate 875-125 MG tablet Commonly known as:  AUGMENTIN Take 1 tablet by mouth every 12 (twelve) hours.   docusate sodium 100 MG capsule Commonly known as:  COLACE Take 1 capsule (100 mg total) by mouth 2 (two) times daily as needed for mild constipation.   DULERA 200-5 MCG/ACT Aero Generic drug:  mometasone-formoterol Inhale 2 puffs into the lungs 2 (two) times daily as needed for wheezing or shortness of breath.   eletriptan 20 MG tablet Commonly known as:  RELPAX Take 20 mg by mouth.   eletriptan 20 MG tablet Commonly known as:  RELPAX Take 20 mg by mouth every 2 (two) hours as needed for migraine. may repeat in 2 hours if necessary   estradiol 0.1 MG/24HR patch Commonly known as:  VIVELLE-DOT 1 patch 2 (two) times a week.   ferrous sulfate 325 (65 FE) MG EC tablet Take 1 tablet (325 mg total) by mouth 2 (two) times daily with a meal.   ibuprofen 600 MG tablet Commonly known as:  ADVIL,MOTRIN 1  po pc every 6 hours for 5 days then prn-pain   loratadine 10 MG tablet Commonly known as:  CLARITIN Take 10 mg by mouth daily as needed for allergies.   magnesium oxide 400 (241.3 Mg) MG tablet Commonly known as:  MAG-OX Take 1 tablet by mouth daily.   Melatonin 2.5 MG Chew Chew 1 tablet by mouth at bedtime as needed (sleep).   ondansetron 4 MG tablet Commonly known as:  ZOFRAN Take 1 tablet (4 mg total) by mouth  every 8 (eight) hours as needed for nausea or vomiting.   prenatal multivitamin Tabs tablet Take 1 tablet by mouth daily at 12 noon. Start taking on:  12/07/2016   topiramate 50 MG tablet Commonly known as:  TOPAMAX Take 50 mg by mouth at bedtime.   traMADol 50 MG tablet Commonly known as:  ULTRAM One or two tablets every 4 hours as needed for severe pain         Signed: Dylan Monforte P 12/06/2016, 7:08 PM

## 2016-12-06 NOTE — Progress Notes (Signed)
Subjective:  Patient reports she wants to go home. She denies nausea or vomiting. Incisional pain is well controlled She is tolerating PO and no problems voiding.  Appreciate Dr. Chilton Greathouse consult   Objective: I have reviewed patient's vital signs, intake and output, medications, labs, microbiology and radiology results.  General: alert, cooperative and no distress Resp: clear to auscultation bilaterally Cardio: regular rate and rhythm, S1, S2 normal, no murmur, click, rub or gallop GI: soft, non-tender; bowel sounds normal; no masses,  no organomegaly.  Incison clean and dry Extremities: extremities normal, atraumatic, no cyanosis or edema Vaginal Bleeding: none   Assessment/Plan: Pt has done well with relief of fever and abdomina pressure on current antibiotic regimen. Will recheck CBC at 5 pm and will d/c home if WBC is stable and she tolerates first dose of Augmentin  LOS: 2 days    Nyeshia Mysliwiec P 12/06/2016, 2:31 PM

## 2016-12-09 LAB — CULTURE, BLOOD (ROUTINE X 2)
CULTURE: NO GROWTH
CULTURE: NO GROWTH

## 2018-09-27 ENCOUNTER — Other Ambulatory Visit: Payer: Self-pay | Admitting: Internal Medicine

## 2018-09-27 DIAGNOSIS — L819 Disorder of pigmentation, unspecified: Secondary | ICD-10-CM

## 2018-09-28 LAB — ANA W/REFLEX: Anti Nuclear Antibody(ANA): NEGATIVE

## 2018-10-03 ENCOUNTER — Other Ambulatory Visit: Payer: Self-pay | Admitting: Internal Medicine

## 2018-10-03 NOTE — Progress Notes (Signed)
Pt advised of neg ANA results on 10/01/18. No further testing needed.

## 2018-10-26 ENCOUNTER — Encounter: Payer: Self-pay | Admitting: Internal Medicine

## 2018-10-26 ENCOUNTER — Ambulatory Visit: Payer: BC Managed Care – PPO | Admitting: Internal Medicine

## 2018-10-26 VITALS — BP 108/64 | HR 71 | Temp 97.8°F | Ht 64.0 in | Wt 202.0 lb

## 2018-10-26 DIAGNOSIS — G43009 Migraine without aura, not intractable, without status migrainosus: Secondary | ICD-10-CM

## 2018-10-26 DIAGNOSIS — Z23 Encounter for immunization: Secondary | ICD-10-CM | POA: Diagnosis not present

## 2018-10-26 DIAGNOSIS — Z0000 Encounter for general adult medical examination without abnormal findings: Secondary | ICD-10-CM | POA: Diagnosis not present

## 2018-10-26 DIAGNOSIS — N6311 Unspecified lump in the right breast, upper outer quadrant: Secondary | ICD-10-CM | POA: Diagnosis not present

## 2018-10-26 DIAGNOSIS — Z803 Family history of malignant neoplasm of breast: Secondary | ICD-10-CM

## 2018-10-26 LAB — POCT URINALYSIS DIPSTICK
BILIRUBIN UA: NEGATIVE
Blood, UA: NEGATIVE
GLUCOSE UA: NEGATIVE
KETONES UA: NEGATIVE
Leukocytes, UA: NEGATIVE
Nitrite, UA: NEGATIVE
Protein, UA: NEGATIVE
SPEC GRAV UA: 1.015 (ref 1.010–1.025)
Urobilinogen, UA: 0.2 E.U./dL
pH, UA: 7 (ref 5.0–8.0)

## 2018-10-26 MED ORDER — PROMETHAZINE HCL 25 MG/ML IJ SOLN
25.0000 mg | Freq: Once | INTRAMUSCULAR | Status: AC
  Administered 2018-10-26: 25 mg via INTRAMUSCULAR

## 2018-10-26 NOTE — Progress Notes (Signed)
Subjective:     Patient ID: [name] , female    DOB: 1966-03-29 , 52 y.o.   MRN: [id number]   Chief Complaint  Patient presents with  . Annual Exam    HPI  SHE IS HERE TODAY FOR A FULL PHYSICAL EXAM. SHE IS FOLLOWED BY DR. Leo Grosser FOR HER GYN EXAMS. SHE REPORTS HER PAP/MAMMO WERE PERFORMED WITHIN LAST TWO MONTHS.    Past Medical History:  Diagnosis Date  . Ankle fracture    left  . Asthma   . Migraine   . Spinal headache       Current Outpatient Medications:  .  Cholecalciferol (VITAMIN D PO), Take by mouth. 300 mg daily, Disp: , Rfl:  .  albuterol (PROVENTIL HFA;VENTOLIN HFA) 108 (90 BASE) MCG/ACT inhaler, Inhale 2 puffs into the lungs every 6 (six) hours as needed for wheezing or shortness of breath., Disp: , Rfl:  .  eletriptan (RELPAX) 20 MG tablet, Take 20 mg by mouth., Disp: , Rfl:  .  estradiol (VIVELLE-DOT) 0.1 MG/24HR patch, 1 patch 2 (two) times a week., Disp: , Rfl:  .  levocetirizine (XYZAL) 5 MG tablet, TAKE 1 TABLET BY MOUTH EVERY EVENING, Disp: 90 tablet, Rfl: 1 .  Melatonin 2.5 MG CHEW, Chew 1 tablet by mouth at bedtime as needed (sleep)., Disp: , Rfl:    Allergies  Allergen Reactions  . Ciprofloxacin Nausea Only    lightheaded     Review of Systems  Constitutional: Negative.   HENT: Negative.   Eyes: Negative.   Respiratory: Negative.   Cardiovascular: Negative.   Gastrointestinal: Negative.   Endocrine: Negative.   Genitourinary: Negative.   Musculoskeletal: Negative.   Skin: Negative.   Allergic/Immunologic: Negative.   Neurological: Positive for headaches (SHE DEVELOPED MIGRAINE ON WAY TO APPT. LAST ONE 2 YEARS AGO. NOT SURE OF TRIGGER. ASSOCIATED WITH NAUSEA. NO AURA. ).  Hematological: Negative.   Psychiatric/Behavioral: Negative.      Today's Vitals   10/26/18 0929  BP: 108/64  Pulse: 71  Temp: 97.8 F (36.6 C)  TempSrc: Oral  Weight: 202 lb (91.6 kg)  Height: _0  (1.626 m)  PainSc: 6   PainLoc: Head   Body mass index  is 34.67 kg/m.   Objective:  Physical Exam  Constitutional: She is oriented to person, place, and time. She appears well-developed and well-nourished.  HENT:  Head: Normocephalic and atraumatic.  Right Ear: External ear normal.  Left Ear: External ear normal.  Nose: Nose normal.  Mouth/Throat: Oropharynx is clear and moist.  Eyes: Pupils are equal, round, and reactive to light. Conjunctivae and EOM are normal.  Neck: Normal range of motion. Neck supple.  Cardiovascular: Normal rate, regular rhythm, normal heart sounds and intact distal pulses.  Pulmonary/Chest: Effort normal and breath sounds normal. Right breast exhibits mass. Right breast exhibits no inverted nipple, no nipple discharge, no skin change and no tenderness. Left breast exhibits no inverted nipple, no mass, no nipple discharge, no skin change and no tenderness.  THERE IS A MASS R BREAST B/W 9/10 O'CLOCK, ROUND, FIRM, NONTENDER. NO OVERLYING ERYTHEMA    Abdominal: Soft. Bowel sounds are normal.  Genitourinary:  Genitourinary Comments: DEFERRED  Musculoskeletal: Normal range of motion.  Neurological: She is alert and oriented to person, place, and time.  Skin: Skin is warm and dry.  Psychiatric: She has a normal mood and affect.  Nursing note and vitals reviewed.       Assessment And Plan:     1.  Encounter for general adult medical examination w/o abnormal findings  A FULL EXAM WAS PERFORMED. IMPORTANCE OF MONTHLY SELF BREAST EXAMS WAS DISCUSSED WITH THE PATIENT. PATIENT HAS BEEN ADVISED TO GET 30-45 MINUTES REGULAR EXERCISE NO LESS THAN FOUR TO FIVE DAYS PER WEEK - BOTH WEIGHTBEARING EXERCISES AND AEROBIC ARE RECOMMENDED.  SHE IS ADVISED TO FOLLOW A HEALTHY DIET WITH AT LEAST SIX FRUITS/VEGGIES PER DAY, DECREASE INTAKE OF RED MEAT, AND TO INCREASE FISH INTAKE TO TWO DAYS PER WEEK.  MEATS/FISH SHOULD NOT BE FRIED, BAKED OR BROILED IS PREFERABLE.  I SUGGEST WEARING SPF 50 SUNSCREEN ON EXPOSED PARTS AND ESPECIALLY WHEN IN  THE DIRECT SUNLIGHT FOR AN EXTENDED PERIOD OF TIME.  PLEASE AVOID FAST FOOD RESTAURANTS AND INCREASE YOUR WATER INTAKE.  - POCT Urinalysis Dipstick (81002) - CBC no Diff - CMP14+EGFR - Hemoglobin A1c - Lipid Profile  2. Migraine without aura and without status migrainosus, not intractable  ACUTE. SHE WAS GIVEN PHENERGAN, 25MG IM X1. SHE IS ALSO ENCOURAGED TO START MG SUPPLEMENTATION NIGHTLY. SHE WILL LET ME KNOW IF HER SX PERSIST/RECUR.   3. Need for vaccination  - Flu Vaccine QUAD 6+ mos PF IM (Fluarix Quad PF)  4. Breast lump on right side at 10 o'clock position  I WILL REFER HER TO THE BREAST CENTER FOR DIAGNOSTIC R BREAST MAMMO AND U/S R BREAST. SHE DOES HAVE FAMILY H/O BREAST CANCER.   - MM DIAG BREAST TOMO UNI RIGHT; Future - [place] Unlisted Procedure Breast; Future    Maximino Greenland, MD

## 2018-10-27 LAB — LIPID PANEL
CHOL/HDL RATIO: 3 ratio (ref 0.0–4.4)
Cholesterol, Total: 192 mg/dL (ref 100–199)
HDL: 64 mg/dL (ref >39)
LDL CALC: 114 mg/dL — AB (ref 0–99)
Triglycerides: 70 mg/dL (ref 0–149)
VLDL CHOLESTEROL CAL: 14 mg/dL (ref 5–40)

## 2018-10-27 LAB — HEMOGLOBIN A1C
Est. average glucose Bld gHb Est-mCnc: 114 mg/dL
HEMOGLOBIN A1C: 5.6 % (ref 4.8–5.6)

## 2018-10-28 NOTE — Progress Notes (Signed)
Your hba1c is 5.6, this is normal. You are not prediabetic. Your LDL, bad chol is 114. Ideally, this should be less than 100 - not bad!  You should hear about mammogram/u/s of r breast early next week. If you have not, pls notify me.

## 2018-11-08 ENCOUNTER — Other Ambulatory Visit: Payer: Self-pay | Admitting: Internal Medicine

## 2018-11-24 ENCOUNTER — Ambulatory Visit
Admission: RE | Admit: 2018-11-24 | Discharge: 2018-11-24 | Disposition: A | Discharge status: Home / Self Care | Payer: BC Managed Care – PPO | Source: Ambulatory Visit | Attending: Internal Medicine | Admitting: Internal Medicine

## 2018-11-24 ENCOUNTER — Other Ambulatory Visit: Payer: Self-pay | Admitting: Internal Medicine

## 2018-11-24 DIAGNOSIS — N6311 Unspecified lump in the right breast, upper outer quadrant: Secondary | ICD-10-CM

## 2018-11-24 DIAGNOSIS — N6310 Unspecified lump in the right breast, unspecified quadrant: Secondary | ICD-10-CM

## 2018-12-02 ENCOUNTER — Other Ambulatory Visit: Payer: Self-pay | Admitting: Internal Medicine

## 2018-12-02 ENCOUNTER — Ambulatory Visit
Admission: RE | Admit: 2018-12-02 | Discharge: 2018-12-02 | Disposition: A | Discharge status: Home / Self Care | Payer: BC Managed Care – PPO | Source: Ambulatory Visit | Attending: Internal Medicine | Admitting: Internal Medicine

## 2018-12-02 DIAGNOSIS — N6310 Unspecified lump in the right breast, unspecified quadrant: Secondary | ICD-10-CM

## 2019-01-31 ENCOUNTER — Other Ambulatory Visit: Payer: Self-pay | Admitting: Internal Medicine

## 2019-03-21 ENCOUNTER — Other Ambulatory Visit: Payer: Self-pay

## 2019-03-21 MED ORDER — FLUTICASONE PROPIONATE 50 MCG/ACT NA SUSP: 1.0000 | Freq: Every day | NASAL | 2 refills | Status: DC

## 2019-03-21 MED ORDER — ALBUTEROL SULFATE (2.5 MG/3ML) 0.083% IN NEBU: 2.5000 mg | INHALATION_SOLUTION | Freq: Four times a day (QID) | RESPIRATORY_TRACT | 12 refills | Status: DC | PRN

## 2019-04-01 ENCOUNTER — Other Ambulatory Visit: Payer: Self-pay | Admitting: Internal Medicine

## 2019-05-06 ENCOUNTER — Other Ambulatory Visit: Payer: Self-pay | Admitting: Internal Medicine

## 2019-05-06 DIAGNOSIS — N6011 Diffuse cystic mastopathy of right breast: Secondary | ICD-10-CM

## 2019-06-06 ENCOUNTER — Ambulatory Visit
Admission: RE | Admit: 2019-06-06 | Discharge: 2019-06-06 | Disposition: A | Discharge status: Home / Self Care | Payer: BC Managed Care – PPO | Source: Ambulatory Visit | Attending: Internal Medicine | Admitting: Internal Medicine

## 2019-06-06 ENCOUNTER — Other Ambulatory Visit: Payer: Self-pay

## 2019-06-06 DIAGNOSIS — N6011 Diffuse cystic mastopathy of right breast: Secondary | ICD-10-CM

## 2019-06-12 ENCOUNTER — Other Ambulatory Visit: Payer: Self-pay | Admitting: Internal Medicine

## 2019-08-03 ENCOUNTER — Other Ambulatory Visit: Payer: Self-pay

## 2019-08-03 MED ORDER — ELETRIPTAN HYDROBROMIDE 20 MG PO TABS: 20.0000 mg | ORAL_TABLET | ORAL | 0 refills | Status: DC | PRN

## 2019-08-06 ENCOUNTER — Other Ambulatory Visit: Payer: Self-pay | Admitting: Internal Medicine

## 2019-09-09 ENCOUNTER — Other Ambulatory Visit: Payer: Self-pay | Admitting: Internal Medicine

## 2019-09-26 ENCOUNTER — Encounter: Payer: Self-pay | Admitting: Internal Medicine

## 2019-09-28 ENCOUNTER — Telehealth: Payer: Self-pay

## 2019-09-28 ENCOUNTER — Other Ambulatory Visit: Payer: Self-pay | Admitting: Internal Medicine

## 2019-09-28 NOTE — Telephone Encounter (Signed)
Left vm to make appt with pt for dm check

## 2019-11-01 ENCOUNTER — Encounter: Payer: BC Managed Care – PPO | Admitting: Internal Medicine

## 2019-11-02 ENCOUNTER — Other Ambulatory Visit: Payer: Self-pay

## 2019-11-02 ENCOUNTER — Encounter: Payer: Self-pay | Admitting: Nurse Practitioner

## 2019-11-02 ENCOUNTER — Ambulatory Visit (INDEPENDENT_AMBULATORY_CARE_PROVIDER_SITE_OTHER): Payer: BC Managed Care – PPO | Admitting: Nurse Practitioner

## 2019-11-02 VITALS — BP 110/60 | HR 60 | Temp 98.8°F | Ht 64.6 in | Wt 212.2 lb

## 2019-11-02 DIAGNOSIS — Z1211 Encounter for screening for malignant neoplasm of colon: Secondary | ICD-10-CM | POA: Diagnosis not present

## 2019-11-02 DIAGNOSIS — Z0000 Encounter for general adult medical examination without abnormal findings: Secondary | ICD-10-CM | POA: Diagnosis not present

## 2019-11-02 DIAGNOSIS — Z139 Encounter for screening, unspecified: Secondary | ICD-10-CM | POA: Diagnosis not present

## 2019-11-02 LAB — POCT URINALYSIS DIPSTICK
Bilirubin, UA: NEGATIVE
Blood, UA: NEGATIVE
Glucose, UA: NEGATIVE
Ketones, UA: NEGATIVE
Leukocytes, UA: NEGATIVE
Nitrite, UA: NEGATIVE
Protein, UA: NEGATIVE
Spec Grav, UA: 1.02 (ref 1.010–1.025)
Urobilinogen, UA: 0.2 E.U./dL
pH, UA: 5.5 (ref 5.0–8.0)

## 2019-11-02 NOTE — Progress Notes (Signed)
Subjective:     Patient ID: [name] , female    DOB: June 24, 1966 , 53 y.o.   MRN: [id number]   Chief Complaint  Patient presents with  . Annual Exam    HPI  Here for HM  She is currently working from home.  Will go into the office sometimes but still will sit   The patient states she uses status post hysterectomy for birth control. Last LMP was Patient's last menstrual period was 11/21/2016 (exact date).  Mammogram last done 06/06/2019. Negative for: breast discharge, breast lump(s), breast pain and breast self exam.  Pertinent negatives include abnormal bleeding (hematology), anxiety, decreased libido, depression, difficulty falling sleep, dyspareunia, history of infertility, nocturia, sexual dysfunction, sleep disturbances, urinary incontinence, urinary urgency, vaginal discharge and vaginal itching. Diet regular, no beef or pork none for 10 years, no meat with hormones, seafood, veggies and fruits.  The patient states her exercise level is moderate - 3-4 walks per week.        The patient's tobacco use is:  Social History   Tobacco Use  Smoking Status Never Smoker  Smokeless Tobacco Never Used   She has been exposed to passive smoke. The patient's alcohol use is:  Social History   Substance and Sexual Activity  Alcohol Use Yes   Comment: occ   Additional information: Last pap 2019, next one scheduled for December 2020 [facility] [place].   Past Medical History:  Diagnosis Date  . Ankle fracture    left  . Asthma   . Migraine   . Spinal headache      Family History  Problem Relation Age of Onset  . Gout Mother   . Diabetes Mother   . Hypertension Mother   . Breast cancer Mother 25  . Asthma Mother   . Heart Problems Mother   . Diabetes Father   . Breast cancer Maternal Aunt        in 74's  . Breast cancer Maternal Aunt        in 66's     Current Outpatient Medications:  .  albuterol (PROVENTIL HFA;VENTOLIN HFA) 108 (90 BASE) MCG/ACT inhaler, Inhale 2  puffs into the lungs every 6 (six) hours as needed for wheezing or shortness of breath., Disp: , Rfl:  .  albuterol (PROVENTIL) (2.5 MG/3ML) 0.083% nebulizer solution, Take 3 mLs (2.5 mg total) by nebulization every 6 (six) hours as needed for wheezing or shortness of breath., Disp: 75 mL, Rfl: 12 .  buPROPion (WELLBUTRIN XL) 150 MG 24 hr tablet, TAKE 1 TABLET BY MOUTH EVERY DAY, Disp: 90 tablet, Rfl: 1 .  eletriptan (RELPAX) 20 MG tablet, Take 1 tablet (20 mg total) by mouth as needed for migraine or headache., Disp: 10 tablet, Rfl: 0 .  estradiol (VIVELLE-DOT) 0.1 MG/24HR patch, 1 patch 2 (two) times a week., Disp: , Rfl:  .  fluticasone (FLONASE) 50 MCG/ACT nasal spray, PLACE 1 SPRAY INTO BOTH NOSTRILS DAILY., Disp: 48 mL, Rfl: 1 .  levocetirizine (XYZAL) 5 MG tablet, TAKE 1 TABLET BY MOUTH EVERY EVENING, Disp: 90 tablet, Rfl: 1 .  Melatonin 2.5 MG CHEW, Chew 1 tablet by mouth at bedtime as needed (sleep)., Disp: , Rfl:  .  Cholecalciferol (VITAMIN D PO), Take by mouth. 300 mg daily, Disp: , Rfl:    Allergies  Allergen Reactions  . Ciprofloxacin Nausea Only    lightheaded     Review of Systems  Constitutional: Negative.   HENT: Negative.   Eyes: Negative.  Respiratory: Negative.   Cardiovascular: Negative.   Gastrointestinal: Negative.   Endocrine: Negative.   Genitourinary: Negative.   Musculoskeletal: Negative.   Skin: Negative.   Allergic/Immunologic: Negative.   Neurological: Negative.   Hematological: Negative.   Psychiatric/Behavioral: Negative.      Today's Vitals   11/02/19 0841  BP: 110/60  Pulse: 60  Temp: 98.8 F (37.1 C)  TempSrc: Oral  Weight: 212 lb 3.2 oz (96.3 kg)  Height: 5' 4.6" (1.641 m)  PainSc: 0-No pain   Body mass index is 35.75 kg/m.   Objective:  Physical Exam Vitals signs reviewed.  Constitutional:      Appearance: Normal appearance. She is well-developed.  HENT:     Head: Normocephalic and atraumatic.     Right Ear: Hearing,  tympanic membrane, ear canal and external ear normal.     Left Ear: Hearing, tympanic membrane, ear canal and external ear normal.  Eyes:     General: Lids are normal.     Extraocular Movements: Extraocular movements intact.     Conjunctiva/sclera: Conjunctivae normal.     Pupils: Pupils are equal, round, and reactive to light.     Funduscopic exam:    Right eye: No papilledema.        Left eye: No papilledema.  Neck:     Musculoskeletal: Full passive range of motion without pain, normal range of motion and neck supple.     Thyroid: No thyroid mass.     Vascular: No carotid bruit.  Cardiovascular:     Rate and Rhythm: Normal rate and regular rhythm.     Pulses: Normal pulses.     Heart sounds: Normal heart sounds. No murmur.  Pulmonary:     Effort: Pulmonary effort is normal.     Breath sounds: Normal breath sounds.  Abdominal:     General: Abdomen is flat. Bowel sounds are normal.     Palpations: Abdomen is soft.  Musculoskeletal: Normal range of motion.        General: No swelling.     Right lower leg: No edema.     Left lower leg: No edema.  Skin:    General: Skin is warm and dry.     Capillary Refill: Capillary refill takes less than 2 seconds.  Neurological:     General: No focal deficit present.     Mental Status: She is alert and oriented to person, place, and time.     Cranial Nerves: No cranial nerve deficit.     Sensory: No sensory deficit.  Psychiatric:        Mood and Affect: Mood normal.        Behavior: Behavior normal.        Thought Content: Thought content normal.        Judgment: Judgment normal.         Assessment And Plan:     1. Encounter for general adult medical examination w/o abnormal findings . Behavior modifications discussed and diet history reviewed. . Pt will continue to exercise regularly and modify diet with low GI, plant based foods and decrease intake of processed foods.  . Recommend intake of daily multivitamin, Vitamin D, and  calcium.  . Recommend mammogram (done in June 2020) and colonoscopy for preventive screenings, as well as recommend immunizations that include influenza, TDAP - POCT Urinalysis Dipstick (81002) - CMP14+EGFR - VITAMIN D 25 Hydroxy (Vit-D Deficiency, Fractures) - Hemoglobin A1c - Lipid panel  2. Encounter for screening colonoscopy  According to USPTF  Colorectal cancer Screening guidelines. Colonoscopy is recommended every 10 years, starting at age 102years.  Will refer to GI for colon cancer screening. - Ambulatory referral to Gastroenterology  3. Encounter for screening  - HIV Antibody (routine testing w rflx)    Minette Brine, FNP    THE PATIENT IS ENCOURAGED TO PRACTICE SOCIAL DISTANCING DUE TO THE COVID-19 PANDEMIC.

## 2019-11-02 NOTE — Patient Instructions (Signed)
Health Maintenance  Topic Date Due  . MAMMOGRAM  05/29/2021  . PAP SMEAR-Modifier  11/01/2022  . TETANUS/TDAP  04/12/2024  . COLONOSCOPY  11/01/2029  . INFLUENZA VACCINE  Completed  . HIV Screening  Completed   Health Maintenance, Female Adopting a healthy lifestyle and getting preventive care are important in promoting health and wellness. Ask your health care provider about:  The right schedule for you to have regular tests and exams.  Things you can do on your own to prevent diseases and keep yourself healthy. What should I know about diet, weight, and exercise? Eat a healthy diet   Eat a diet that includes plenty of vegetables, fruits, low-fat dairy products, and lean protein.  Do not eat a lot of foods that are high in solid fats, added sugars, or sodium. Maintain a healthy weight Body mass index (BMI) is used to identify weight problems. It estimates body fat based on height and weight. Your health care provider can help determine your BMI and help you achieve or maintain a healthy weight. Get regular exercise Get regular exercise. This is one of the most important things you can do for your health. Most adults should:  Exercise for at least 150 minutes each week. The exercise should increase your heart rate and make you sweat (moderate-intensity exercise).  Do strengthening exercises at least twice a week. This is in addition to the moderate-intensity exercise.  Spend less time sitting. Even light physical activity can be beneficial. Watch cholesterol and blood lipids Have your blood tested for lipids and cholesterol at 51 years of age, then have this test every 5 years. Have your cholesterol levels checked more often if:  Your lipid or cholesterol levels are high.  You are older than 51 years of age.  You are at high risk for heart disease. What should I know about cancer screening? Depending on your health history and family history, you may need to have cancer  screening at various ages. This may include screening for:  Breast cancer.  Cervical cancer.  Colorectal cancer.  Skin cancer.  Lung cancer. What should I know about heart disease, diabetes, and high blood pressure? Blood pressure and heart disease  High blood pressure causes heart disease and increases the risk of stroke. This is more likely to develop in people who have high blood pressure readings, are of African descent, or are overweight.  Have your blood pressure checked: ? Every 3-5 years if you are 65-45 years of age. ? Every year if you are 17 years old or older. Diabetes Have regular diabetes screenings. This checks your fasting blood sugar level. Have the screening done:  Once every three years after age 40 if you are at a normal weight and have a low risk for diabetes.  More often and at a younger age if you are overweight or have a high risk for diabetes. What should I know about preventing infection? Hepatitis B If you have a higher risk for hepatitis B, you should be screened for this virus. Talk with your health care provider to find out if you are at risk for hepatitis B infection. Hepatitis C Testing is recommended for:  Everyone born from [birth year] through 1965.  Anyone with known risk factors for hepatitis C. Sexually transmitted infections (STIs)  Get screened for STIs, including gonorrhea and chlamydia, if: ? You are sexually active and are younger than 51 years of age. ? You are older than 51 years of age and your health  care provider tells you that you are at risk for this type of infection. ? Your sexual activity has changed since you were last screened, and you are at increased risk for chlamydia or gonorrhea. Ask your health care provider if you are at risk.  Ask your health care provider about whether you are at high risk for HIV. Your health care provider may recommend a prescription medicine to help prevent HIV infection. If you choose to take  medicine to prevent HIV, you should first get tested for HIV. You should then be tested every 3 months for as long as you are taking the medicine. Pregnancy  If you are about to stop having your period (premenopausal) and you may become pregnant, seek counseling before you get pregnant.  Take 400 to 800 micrograms (mcg) of folic acid every day if you become pregnant.  Ask for birth control (contraception) if you want to prevent pregnancy. Osteoporosis and menopause Osteoporosis is a disease in which the bones lose minerals and strength with aging. This can result in bone fractures. If you are 23 years old or older, or if you are at risk for osteoporosis and fractures, ask your health care provider if you should:  Be screened for bone loss.  Take a calcium or vitamin D supplement to lower your risk of fractures.  Be given hormone replacement therapy (HRT) to treat symptoms of menopause. Follow these instructions at home: Lifestyle  Do not use any products that contain nicotine or tobacco, such as cigarettes, e-cigarettes, and chewing tobacco. If you need help quitting, ask your health care provider.  Do not use street drugs.  Do not share needles.  Ask your health care provider for help if you need support or information about quitting drugs. Alcohol use  Do not drink alcohol if: ? Your health care provider tells you not to drink. ? You are pregnant, may be pregnant, or are planning to become pregnant.  If you drink alcohol: ? Limit how much you use to 0-1 drink a day. ? Limit intake if you are breastfeeding.  Be aware of how much alcohol is in your drink. In the [place] one drink equals one 12 oz bottle of beer (355 mL), one 5 oz glass of wine (148 mL), or one 1 oz glass of hard liquor (44 mL). General instructions  Schedule regular health, dental, and eye exams.  Stay current with your vaccines.  Tell your health care provider if: ? You often feel depressed. ? You have  ever been abused or do not feel safe at home. Summary  Adopting a healthy lifestyle and getting preventive care are important in promoting health and wellness.  Follow your health care provider's instructions about healthy diet, exercising, and getting tested or screened for diseases.  Follow your health care provider's instructions on monitoring your cholesterol and blood pressure. This information is not intended to replace advice given to you by your health care provider. Make sure you discuss any questions you have with your health care provider. Document Released: 06/30/2011 Document Revised: 12/08/2018 Document Reviewed: 12/08/2018 Elsevier Patient Education  2020 [employer].

## 2019-11-03 ENCOUNTER — Other Ambulatory Visit: Payer: BC Managed Care – PPO

## 2019-11-03 ENCOUNTER — Other Ambulatory Visit: Payer: Self-pay

## 2019-11-03 MED ORDER — ALBUTEROL SULFATE HFA 108 (90 BASE) MCG/ACT IN AERS: 2.0000 | INHALATION_SPRAY | Freq: Four times a day (QID) | RESPIRATORY_TRACT | 2 refills | Status: DC | PRN

## 2019-11-04 LAB — CMP14+EGFR
ALT: 25 IU/L (ref 0–32)
AST: 24 IU/L (ref 0–40)
Albumin/Globulin Ratio: 1.3 (ref 1.2–2.2)
Albumin: 4 g/dL (ref 3.8–4.8)
Alkaline Phosphatase: 78 IU/L (ref 39–117)
BUN/Creatinine Ratio: 23 (ref 9–23)
BUN: 15 mg/dL (ref 6–24)
Bilirubin Total: 0.3 mg/dL (ref 0.0–1.2)
CO2: 21 mmol/L (ref 20–29)
Calcium: 9.2 mg/dL (ref 8.7–10.2)
Chloride: 101 mmol/L (ref 96–106)
Creatinine, Ser: 0.66 mg/dL (ref 0.57–1.00)
GFR calc Af Amer: 119 mL/min/{1.73_m2} (ref >59)
GFR calc non Af Amer: 103 mL/min/{1.73_m2} (ref >59)
Globulin, Total: 3.2 g/dL (ref 1.5–4.5)
Glucose: 88 mg/dL (ref 65–99)
Potassium: 4.3 mmol/L (ref 3.5–5.2)
Sodium: 136 mmol/L (ref 134–144)
Total Protein: 7.2 g/dL (ref 6.0–8.5)

## 2019-11-04 LAB — HEMOGLOBIN A1C
Est. average glucose Bld gHb Est-mCnc: 111 mg/dL
Hgb A1c MFr Bld: 5.5 % (ref 4.8–5.6)

## 2019-11-04 LAB — LIPID PANEL
Chol/HDL Ratio: 2.9 ratio (ref 0.0–4.4)
Cholesterol, Total: 201 mg/dL — ABNORMAL HIGH (ref 100–199)
HDL: 70 mg/dL (ref >39)
LDL Chol Calc (NIH): 116 mg/dL — ABNORMAL HIGH (ref 0–99)
Triglycerides: 81 mg/dL (ref 0–149)
VLDL Cholesterol Cal: 15 mg/dL (ref 5–40)

## 2019-11-04 LAB — HIV ANTIBODY (ROUTINE TESTING W REFLEX): HIV Screen 4th Generation wRfx: NONREACTIVE

## 2019-11-04 LAB — VITAMIN D 25 HYDROXY (VIT D DEFICIENCY, FRACTURES): Vit D, 25-Hydroxy: 32.1 ng/mL (ref 30.0–100.0)

## 2019-11-08 ENCOUNTER — Other Ambulatory Visit: Payer: Self-pay | Admitting: Internal Medicine

## 2019-12-20 NOTE — Progress Notes (Signed)
May be found in yellow of egg or cheese.

## 2019-12-28 LAB — HM COLONOSCOPY

## 2019-12-29 ENCOUNTER — Encounter: Payer: Self-pay | Admitting: Nurse Practitioner

## 2020-01-02 ENCOUNTER — Encounter: Payer: Self-pay | Admitting: Internal Medicine

## 2020-01-02 ENCOUNTER — Encounter: Payer: Self-pay | Admitting: Nurse Practitioner

## 2020-02-05 ENCOUNTER — Other Ambulatory Visit: Payer: Self-pay | Admitting: Internal Medicine

## 2020-05-04 ENCOUNTER — Other Ambulatory Visit: Payer: Self-pay | Admitting: Internal Medicine

## 2020-05-14 ENCOUNTER — Other Ambulatory Visit: Payer: Self-pay | Admitting: Internal Medicine

## 2020-07-03 ENCOUNTER — Encounter: Payer: Self-pay | Admitting: Internal Medicine

## 2020-07-03 ENCOUNTER — Ambulatory Visit: Payer: BC Managed Care – PPO | Admitting: Internal Medicine

## 2020-07-03 ENCOUNTER — Other Ambulatory Visit: Payer: Self-pay

## 2020-07-03 VITALS — BP 112/74 | HR 79 | Temp 98.2°F | Ht 64.6 in | Wt 217.4 lb

## 2020-07-03 DIAGNOSIS — F419 Anxiety disorder, unspecified: Secondary | ICD-10-CM

## 2020-07-03 DIAGNOSIS — G43009 Migraine without aura, not intractable, without status migrainosus: Secondary | ICD-10-CM

## 2020-07-03 DIAGNOSIS — E6609 Other obesity due to excess calories: Secondary | ICD-10-CM | POA: Diagnosis not present

## 2020-07-03 DIAGNOSIS — F5102 Adjustment insomnia: Secondary | ICD-10-CM | POA: Diagnosis not present

## 2020-07-03 DIAGNOSIS — Z6836 Body mass index (BMI) 36.0-36.9, adult: Secondary | ICD-10-CM

## 2020-07-03 MED ORDER — LEVOCETIRIZINE DIHYDROCHLORIDE 5 MG PO TABS: ORAL_TABLET | ORAL | 1 refills | Status: AC

## 2020-07-03 MED ORDER — ELETRIPTAN HYDROBROMIDE 20 MG PO TABS: 20.0000 mg | ORAL_TABLET | ORAL | 0 refills | Status: DC | PRN

## 2020-07-03 MED ORDER — BUPROPION HCL ER (XL) 150 MG PO TB24: 150.0000 mg | ORAL_TABLET | Freq: Every day | ORAL | 1 refills | Status: DC

## 2020-07-03 MED ORDER — ALBUTEROL SULFATE HFA 108 (90 BASE) MCG/ACT IN AERS: 2.0000 | INHALATION_SPRAY | Freq: Four times a day (QID) | RESPIRATORY_TRACT | 2 refills | Status: DC | PRN

## 2020-07-03 NOTE — Patient Instructions (Signed)

## 2020-07-03 NOTE — Progress Notes (Signed)
This visit occurred during the SARS-CoV-2 public health emergency. Safety protocols were in place, including screening questions prior to the visit, additional usage of staff PPE, and extensive cleaning of exam room while observing appropriate contact time as indicated for disinfecting solutions.  Subjective:     Patient ID: [name] , female    DOB: Aug 27, 1966 , 54 y.o.   MRN: [id number]      Chief Complaint  Patient presents with  . Relpax f/u  . Bupropion f/u    She presents today for f/u migraines and anxiety. She reports that her migraines have increased in frequency slightly. She now has a new position at [employer]. 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 However, being in a new environment, especially during the pandemic has been a little stressful.  She has been taking Relpax without any issues.   Regarding anxiety, she is still on bupropion XL. She has not had any issues with the medication. She plans to resume therapy in the near future. She is established with [facility] at [place]. This was discontinued last year b/c her therapist felt she was stable. However, she moved into her new position shortly thereafter.         Past Medical History:  Diagnosis Date  . Ankle fracture    left  . Asthma   . Migraine   . Spinal headache           Family History  Problem Relation Age of Onset  . Gout Mother   . Diabetes Mother   . Hypertension Mother   . Breast cancer Mother 68  . Asthma Mother   . Heart Problems Mother   . Diabetes Father   . Breast cancer Maternal Aunt        in 11's  . Breast cancer Maternal Aunt        in 40's     Current Outpatient Medications:  .  albuterol (PROVENTIL) (2.5 MG/3ML) 0.083% nebulizer solution, Take 3 mLs (2.5 mg total) by nebulization every 6 (six) hours as needed for wheezing or shortness of breath., Disp: 75 mL, Rfl: 12 .  albuterol (VENTOLIN HFA) 108 (90 Base) MCG/ACT inhaler, Inhale 2 puffs into the  lungs every 6 (six) hours as needed for wheezing or shortness of breath., Disp: 18 g, Rfl: 2 .  buPROPion (WELLBUTRIN XL) 150 MG 24 hr tablet, Take 1 tablet (150 mg total) by mouth daily., Disp: 90 tablet, Rfl: 1 .  eletriptan (RELPAX) 20 MG tablet, Take 1 tablet (20 mg total) by mouth as needed for migraine or headache., Disp: 10 tablet, Rfl: 0 .  levocetirizine (XYZAL) 5 MG tablet, TAKE 1 TABLET BY MOUTH EVERY DAY IN THE EVENING, Disp: 90 tablet, Rfl: 1 .  Melatonin 2.5 MG CHEW, Chew 1 tablet by mouth at bedtime as needed (sleep)., Disp: , Rfl:  .  Cholecalciferol (VITAMIN D PO), Take by mouth. 300 mg daily (Patient not taking: Reported on 07/03/2020), Disp: , Rfl:  .  estradiol (VIVELLE-DOT) 0.1 MG/24HR patch, 1 patch 2 (two) times a week. (Patient not taking: Reported on 07/03/2020), Disp: , Rfl:  .  fluticasone (FLONASE) 50 MCG/ACT nasal spray, PLACE 1 SPRAY INTO BOTH NOSTRILS DAILY. (Patient not taking: Reported on 07/03/2020), Disp: 48 mL, Rfl: 1        Allergies  Allergen Reactions  . Ciprofloxacin Nausea Only    lightheaded    Review of Systems  Constitutional: Negative.   Respiratory: Negative.   Cardiovascular: Negative.   Gastrointestinal:  Negative.   Neurological: Negative.   Psychiatric/Behavioral: The patient is nervous/anxious and has insomnia.         Today's Vitals   07/03/20 1554  BP: 112/74  Pulse: 79  Temp: 98.2 F (36.8 C)  TempSrc: Oral  Weight: 217 lb 6.4 oz (98.6 kg)  Height: 5' 4.6" (1.641 m)   Body mass index is 36.63 kg/m.   Objective:   Physical Exam Vitals and nursing note reviewed.  Constitutional:      Appearance: Normal appearance.  HENT:     Head: Normocephalic and atraumatic.     Nose:     Comments: Deferred, masked    Mouth/Throat:     Comments: Deferred, masked Cardiovascular:     Rate and Rhythm: Normal rate and regular rhythm.     Heart sounds: Normal heart sounds.  Neurological:     General: No focal deficit present.      Mental Status: She is alert.  Psychiatric:        Mood and Affect: Mood normal.        Behavior: Behavior normal.        Assessment And Plan:     1. Migraine without aura and without status migrainosus, not intractable  Chronic, yet stable. We discussed the use of magnesium supplementation to help decrease severity/frequency of migraine headaches. She was also given samples of Nurtec and Ubrelvy 50mg  to take once daily prn migraine. Possible side effects discussed with patient. She was given samples so she could determine which medication is most effective at relieving her migraine headaches. She is encouraged to let me know her preferences when applicable.   2. Anxiety  Chronic, yet stable. She will continue with current meds. She will rto in Nov 2021 for her next physical examination.   3. Adjustment insomnia  Again, magnesium supplementation was discussed with the patient. She already has a bedtime routine, which is great! She will continue with these practices. Will consider pharmaceutical agents if her sx persist/worsen.   4. Class 2 obesity due to excess calories without serious comorbidity with body mass index (BMI) of 36.0 to 36.9 in adult  She is encouraged to strive for BMI less than 30 to decrease cardiac risk. Advised to aim for at least 30 minutes of exercise five days per week.   Maximino Greenland, MD     THE PATIENT IS ENCOURAGED TO PRACTICE SOCIAL DISTANCING DUE TO THE COVID-19 PANDEMIC.

## 2020-07-05 ENCOUNTER — Telehealth: Payer: Self-pay

## 2020-07-05 NOTE — Telephone Encounter (Signed)
The pt called and said that what she felt like is an ingrown hair started draining fluid when she got out the shower.  The patient said that she didn't want the area to become infected.   I called the pt back to let her know that Dr Baird Cancer isn't able to properly treat the pt because the pt didn't allow her to look at the area during her visit yesterday.  Dr Baird Cancer wants the pt to make sure the pt puts neosporin on the area.

## 2020-07-06 ENCOUNTER — Telehealth: Payer: Self-pay

## 2020-07-06 NOTE — Telephone Encounter (Signed)
The pt left a message last night that she has continued to use warm compresses on the ingrown hair bump, that the drainage is giving it some relief, she took Ibuprofen when the pain was bad but that it seems to be getting better.  The pt was told that Dr. Baird Cancer said that she didn't get to look at the area because she asked and the pt didn't show her the area, she wants the pt to continue with the warm compresses and to use neosporin on the area after the area is cleaned to help keep the area from getting infected.

## 2020-07-27 ENCOUNTER — Other Ambulatory Visit: Payer: Self-pay | Admitting: Internal Medicine

## 2020-08-20 ENCOUNTER — Ambulatory Visit: Payer: BC Managed Care – PPO | Admitting: Internal Medicine

## 2020-09-13 ENCOUNTER — Other Ambulatory Visit: Payer: Self-pay | Admitting: Nurse Practitioner

## 2020-11-13 ENCOUNTER — Encounter: Payer: BC Managed Care – PPO | Admitting: Internal Medicine

## 2020-12-19 ENCOUNTER — Ambulatory Visit: Payer: BC Managed Care – PPO | Admitting: Internal Medicine

## 2020-12-19 ENCOUNTER — Other Ambulatory Visit: Payer: Self-pay

## 2020-12-19 ENCOUNTER — Encounter: Payer: Self-pay | Admitting: Internal Medicine

## 2020-12-19 VITALS — BP 108/66 | HR 63 | Temp 97.9°F | Ht 64.6 in | Wt 197.2 lb

## 2020-12-19 DIAGNOSIS — Z0000 Encounter for general adult medical examination without abnormal findings: Secondary | ICD-10-CM

## 2020-12-19 DIAGNOSIS — L309 Dermatitis, unspecified: Secondary | ICD-10-CM | POA: Diagnosis not present

## 2020-12-19 DIAGNOSIS — Z1231 Encounter for screening mammogram for malignant neoplasm of breast: Secondary | ICD-10-CM

## 2020-12-19 DIAGNOSIS — Z23 Encounter for immunization: Secondary | ICD-10-CM

## 2020-12-19 DIAGNOSIS — Z6833 Body mass index (BMI) 33.0-33.9, adult: Secondary | ICD-10-CM

## 2020-12-19 DIAGNOSIS — R2232 Localized swelling, mass and lump, left upper limb: Secondary | ICD-10-CM | POA: Diagnosis not present

## 2020-12-19 DIAGNOSIS — E6609 Other obesity due to excess calories: Secondary | ICD-10-CM | POA: Diagnosis not present

## 2020-12-19 MED ORDER — SHINGRIX 50 MCG/0.5ML IM SUSR: 0.5000 mL | Freq: Once | INTRAMUSCULAR | 0 refills | Status: AC

## 2020-12-19 MED ORDER — ELETRIPTAN HYDROBROMIDE 20 MG PO TABS: ORAL_TABLET | ORAL | 1 refills | Status: DC

## 2020-12-19 MED ORDER — ALBUTEROL SULFATE HFA 108 (90 BASE) MCG/ACT IN AERS: 2.0000 | INHALATION_SPRAY | Freq: Four times a day (QID) | RESPIRATORY_TRACT | 2 refills | Status: DC | PRN

## 2020-12-19 MED ORDER — CLOTRIMAZOLE-BETAMETHASONE 1-0.05 % EX CREA: TOPICAL_CREAM | CUTANEOUS | 1 refills | Status: AC

## 2020-12-19 NOTE — Patient Instructions (Signed)
Health Maintenance, Female Adopting a healthy lifestyle and getting preventive care are important in promoting health and wellness. Ask your health care provider about:  The right schedule for you to have regular tests and exams.  Things you can do on your own to prevent diseases and keep yourself healthy. What should I know about diet, weight, and exercise? Eat a healthy diet   Eat a diet that includes plenty of vegetables, fruits, low-fat dairy products, and lean protein.  Do not eat a lot of foods that are high in solid fats, added sugars, or sodium. Maintain a healthy weight Body mass index (BMI) is used to identify weight problems. It estimates body fat based on height and weight. Your health care provider can help determine your BMI and help you achieve or maintain a healthy weight. Get regular exercise Get regular exercise. This is one of the most important things you can do for your health. Most adults should:  Exercise for at least 150 minutes each week. The exercise should increase your heart rate and make you sweat (moderate-intensity exercise).  Do strengthening exercises at least twice a week. This is in addition to the moderate-intensity exercise.  Spend less time sitting. Even light physical activity can be beneficial. Watch cholesterol and blood lipids Have your blood tested for lipids and cholesterol at 52 years of age, then have this test every 5 years. Have your cholesterol levels checked more often if:  Your lipid or cholesterol levels are high.  You are older than 52 years of age.  You are at high risk for heart disease. What should I know about cancer screening? Depending on your health history and family history, you may need to have cancer screening at various ages. This may include screening for:  Breast cancer.  Cervical cancer.  Colorectal cancer.  Skin cancer.  Lung cancer. What should I know about heart disease, diabetes, and high blood  pressure? Blood pressure and heart disease  High blood pressure causes heart disease and increases the risk of stroke. This is more likely to develop in people who have high blood pressure readings, are of African descent, or are overweight.  Have your blood pressure checked: ? Every 3-5 years if you are 18-39 years of age. ? Every year if you are 40 years old or older. Diabetes Have regular diabetes screenings. This checks your fasting blood sugar level. Have the screening done:  Once every three years after age 40 if you are at a normal weight and have a low risk for diabetes.  More often and at a younger age if you are overweight or have a high risk for diabetes. What should I know about preventing infection? Hepatitis B If you have a higher risk for hepatitis B, you should be screened for this virus. Talk with your health care provider to find out if you are at risk for hepatitis B infection. Hepatitis C Testing is recommended for:  Everyone born from 1945 through 1965.  Anyone with known risk factors for hepatitis C. Sexually transmitted infections (STIs)  Get screened for STIs, including gonorrhea and chlamydia, if: ? You are sexually active and are younger than 52 years of age. ? You are older than 52 years of age and your health care provider tells you that you are at risk for this type of infection. ? Your sexual activity has changed since you were last screened, and you are at increased risk for chlamydia or gonorrhea. Ask your health care provider if   you are at risk.  Ask your health care provider about whether you are at high risk for HIV. Your health care provider may recommend a prescription medicine to help prevent HIV infection. If you choose to take medicine to prevent HIV, you should first get tested for HIV. You should then be tested every 3 months for as long as you are taking the medicine. Pregnancy  If you are about to stop having your period (premenopausal) and  you may become pregnant, seek counseling before you get pregnant.  Take 400 to 800 micrograms (mcg) of folic acid every day if you become pregnant.  Ask for birth control (contraception) if you want to prevent pregnancy. Osteoporosis and menopause Osteoporosis is a disease in which the bones lose minerals and strength with aging. This can result in bone fractures. If you are 65 years old or older, or if you are at risk for osteoporosis and fractures, ask your health care provider if you should:  Be screened for bone loss.  Take a calcium or vitamin D supplement to lower your risk of fractures.  Be given hormone replacement therapy (HRT) to treat symptoms of menopause. Follow these instructions at home: Lifestyle  Do not use any products that contain nicotine or tobacco, such as cigarettes, e-cigarettes, and chewing tobacco. If you need help quitting, ask your health care provider.  Do not use street drugs.  Do not share needles.  Ask your health care provider for help if you need support or information about quitting drugs. Alcohol use  Do not drink alcohol if: ? Your health care provider tells you not to drink. ? You are pregnant, may be pregnant, or are planning to become pregnant.  If you drink alcohol: ? Limit how much you use to 0-1 drink a day. ? Limit intake if you are breastfeeding.  Be aware of how much alcohol is in your drink. In the [place] one drink equals one 12 oz bottle of beer (355 mL), one 5 oz glass of wine (148 mL), or one 1 oz glass of hard liquor (44 mL). General instructions  Schedule regular health, dental, and eye exams.  Stay current with your vaccines.  Tell your health care provider if: ? You often feel depressed. ? You have ever been abused or do not feel safe at home. Summary  Adopting a healthy lifestyle and getting preventive care are important in promoting health and wellness.  Follow your health care provider's instructions about healthy  diet, exercising, and getting tested or screened for diseases.  Follow your health care provider's instructions on monitoring your cholesterol and blood pressure. This information is not intended to replace advice given to you by your health care provider. Make sure you discuss any questions you have with your health care provider. Document Revised: 12/08/2018 Document Reviewed: 12/08/2018 Elsevier Patient Education  2020 [employer] Inc[employer]  

## 2020-12-19 NOTE — Progress Notes (Signed)
I,Katawbba Wiggins,acting as a Education administrator for Maximino Greenland, MD.,have documented all relevant documentation on the behalf of Maximino Greenland, MD,as directed by  Maximino Greenland, MD while in the presence of Maximino Greenland, MD.  This visit occurred during the SARS-CoV-2 public health emergency.  Safety protocols were in place, including screening questions prior to the visit, additional usage of staff PPE, and extensive cleaning of exam room while observing appropriate contact time as indicated for disinfecting solutions.  Subjective:     Patient ID: [name] , female    DOB: December 31, 1965 , 54 y.o.   MRN: [id number]   Chief Complaint  Patient presents with  . Annual Exam    HPI  She is here today for a full physical examination. She is followed by Dr. Charlesetta Garibaldi for her Gyn exams.  She was last seen in Sept 2021.     Past Medical History:  Diagnosis Date  . Ankle fracture    left  . Asthma   . Migraine   . Spinal headache      Family History  Problem Relation Age of Onset  . Gout Mother   . Diabetes Mother   . Hypertension Mother   . Breast cancer Mother 7  . Asthma Mother   . Heart Problems Mother   . Diabetes Father   . Breast cancer Maternal Aunt        in 60's  . Breast cancer Maternal Aunt        in 40's     Current Outpatient Medications:  .  albuterol (PROVENTIL) (2.5 MG/3ML) 0.083% nebulizer solution, Take 3 mLs (2.5 mg total) by nebulization every 6 (six) hours as needed for wheezing or shortness of breath., Disp: 75 mL, Rfl: 12 .  levocetirizine (XYZAL) 5 MG tablet, TAKE 1 TABLET BY MOUTH EVERY DAY IN THE EVENING, Disp: 90 tablet, Rfl: 1 .  albuterol (VENTOLIN HFA) 108 (90 Base) MCG/ACT inhaler, Inhale 2 puffs into the lungs every 6 (six) hours as needed for wheezing or shortness of breath., Disp: 18 g, Rfl: 2 .  Cholecalciferol (VITAMIN D PO), Take by mouth. 300 mg daily (Patient not taking: No sig reported), Disp: , Rfl:  .  clotrimazole-betamethasone  (LOTRISONE) cream, Apply to affected area 2 times daily prn, Disp: 15 g, Rfl: 1 .  eletriptan (RELPAX) 20 MG tablet, TAKE 1 TABLET BY MOUTH EVERY DAY AS NEEDED FOR MIGRAINE OR HEADACE, Disp: 10 tablet, Rfl: 1 .  estradiol (VIVELLE-DOT) 0.1 MG/24HR patch, 1 patch 2 (two) times a week. (Patient not taking: No sig reported), Disp: , Rfl:  .  fluticasone (FLONASE) 50 MCG/ACT nasal spray, PLACE 1 SPRAY INTO BOTH NOSTRILS DAILY. (Patient not taking: Reported on 12/19/2020), Disp: 48 mL, Rfl: 1 .  Melatonin 2.5 MG CHEW, Chew 1 tablet by mouth at bedtime as needed (sleep). (Patient not taking: Reported on 12/19/2020), Disp: , Rfl:    Allergies  Allergen Reactions  . Ciprofloxacin Nausea Only    lightheaded      The patient states she uses none for birth control. Last LMP was Patient's last menstrual period was 11/21/2016 (exact date).. Negative for Dysmenorrhea. Negative for: breast discharge, breast lump(s), breast pain and breast self exam. Associated symptoms include abnormal vaginal bleeding. Pertinent negatives include abnormal bleeding (hematology), anxiety, decreased libido, depression, difficulty falling sleep, dyspareunia, history of infertility, nocturia, sexual dysfunction, sleep disturbances, urinary incontinence, urinary urgency, vaginal discharge and vaginal itching. Diet regular.The patient states her exercise level is  moderate.  . The patient's tobacco use is:  Social History   Tobacco Use  Smoking Status Never Smoker  Smokeless Tobacco Never Used  . She has been exposed to passive smoke. The patient's alcohol use is:  Social History   Substance and Sexual Activity  Alcohol Use Yes   Comment: occ    Review of Systems  Constitutional: Negative.   HENT: Negative.   Eyes: Negative.   Respiratory: Negative.   Cardiovascular: Negative.   Endocrine: Negative.   Genitourinary: Negative.   Musculoskeletal: Negative.   Skin: Negative.   Allergic/Immunologic: Negative.    Neurological: Negative.   Hematological: Negative.   Psychiatric/Behavioral: Negative.      Today's Vitals   12/19/20 1208  BP: 108/66  Pulse: 63  Temp: 97.9 F (36.6 C)  TempSrc: Oral  Weight: 197 lb 3.2 oz (89.4 kg)  Height: 5' 4.6" (1.641 m)   Body mass index is 33.22 kg/m.  Wt Readings from Last 3 Encounters:  12/19/20 197 lb 3.2 oz (89.4 kg)  07/03/20 217 lb 6.4 oz (98.6 kg)  11/02/19 212 lb 3.2 oz (96.3 kg)   Objective:  Physical Exam Vitals and nursing note reviewed.  Constitutional:      Appearance: Normal appearance.  HENT:     Head: Normocephalic and atraumatic.     Right Ear: Tympanic membrane, ear canal and external ear normal.     Left Ear: Tympanic membrane, ear canal and external ear normal.     Nose:     Comments: Deferred, masked    Mouth/Throat:     Comments: Deferred, masked Eyes:     Extraocular Movements: Extraocular movements intact.     Conjunctiva/sclera: Conjunctivae normal.     Pupils: Pupils are equal, round, and reactive to light.  Cardiovascular:     Rate and Rhythm: Normal rate and regular rhythm.     Pulses: Normal pulses.     Heart sounds: Normal heart sounds.  Pulmonary:     Effort: Pulmonary effort is normal.     Breath sounds: Normal breath sounds.  Chest:  Breasts:     Tanner Score is 5.     Right: Normal. No supraclavicular adenopathy.     Left: Normal. No supraclavicular adenopathy.        Comments: Soft tissue mass, left axilla  About 1.5cm No overlying erythema, nontender Abdominal:     General: Bowel sounds are normal.     Palpations: Abdomen is soft.  Genitourinary:    Comments: deferred Musculoskeletal:        General: Normal range of motion.     Cervical back: Normal range of motion and neck supple.  Lymphadenopathy:     Upper Body:     Right upper body: No supraclavicular adenopathy.     Left upper body: No supraclavicular adenopathy.  Skin:    General: Skin is warm and dry.     Comments:  Hypo/hyperpigmented rash at base of neck/bl No vesicular lesions noted  Neurological:     General: No focal deficit present.     Mental Status: She is alert and oriented to person, place, and time.  Psychiatric:        Mood and Affect: Mood normal.        Behavior: Behavior normal.         Assessment And Plan:     1. Encounter for general adult medical examination w/o abnormal findings Comments: A full exam was performed. Importance of monthly self breast exams was  discussed with the patient. PATIENT IS ADVISED TO GET 30-45 MINUTES REGULAR EXERCISE NO LESS THAN FOUR TO FIVE DAYS PER WEEK - BOTH WEIGHTBEARING EXERCISES AND AEROBIC ARE RECOMMENDED.  PATIENT IS ADVISED TO FOLLOW A HEALTHY DIET WITH AT LEAST SIX FRUITS/VEGGIES PER DAY, DECREASE INTAKE OF RED MEAT, AND TO INCREASE FISH INTAKE TO TWO DAYS PER WEEK.  MEATS/FISH SHOULD NOT BE FRIED, BAKED OR BROILED IS PREFERABLE.  I SUGGEST WEARING SPF 50 SUNSCREEN ON EXPOSED PARTS AND ESPECIALLY WHEN IN THE DIRECT SUNLIGHT FOR AN EXTENDED PERIOD OF TIME.  PLEASE AVOID FAST FOOD RESTAURANTS AND INCREASE YOUR WATER INTAKE. - Hepatitis C antibody - CBC - CMP14+EGFR - Lipid panel  2. Dermatitis Comments: She was given rx lotrisone cream to apply to affected area twice daily as needed. Advised to contact me in 2-3 weeks to let me know how this is working for her.  3. Axillary mass, left Comments: Due to location, will refer for u/s and possible diagnostic mammogram. - MM Digital Diagnostic Unilat L; Future - [place] Unlisted Procedure Breast; Future  4. Class 1 obesity due to excess calories without serious comorbidity with body mass index (BMI) of 33.0 to 33.9 in adult Comments: She was congratulated on her 20 pound weight loss and encouraged to keep up the great work. She is encouraged to c/w her regular exercise regimen.   5. Encounter for screening mammogram for malignant neoplasm of breast Comments: She is currently overdue. I will send her for  mammogram at the [facility]. - MM Digital Screening Unilat R; Future  6. Immunization due Comments: I will send rx Shingrix to her local pharmacy.   Patient was given opportunity to ask questions. Patient verbalized understanding of the plan and was able to repeat key elements of the plan. All questions were answered to their satisfaction.  Maximino Greenland, MD   I, Maximino Greenland, MD, have reviewed all documentation for this visit. The documentation on 12/23/20 for the exam, diagnosis, procedures, and orders are all accurate and complete.  THE PATIENT IS ENCOURAGED TO PRACTICE SOCIAL DISTANCING DUE TO THE COVID-19 PANDEMIC.

## 2020-12-20 LAB — LIPID PANEL
Chol/HDL Ratio: 2.7 ratio (ref 0.0–4.4)
Cholesterol, Total: 223 mg/dL — ABNORMAL HIGH (ref 100–199)
HDL: 82 mg/dL (ref >39)
LDL Chol Calc (NIH): 130 mg/dL — ABNORMAL HIGH (ref 0–99)
Triglycerides: 62 mg/dL (ref 0–149)
VLDL Cholesterol Cal: 11 mg/dL (ref 5–40)

## 2020-12-20 LAB — CMP14+EGFR
ALT: 23 IU/L (ref 0–32)
AST: 24 IU/L (ref 0–40)
Albumin/Globulin Ratio: 1.4 (ref 1.2–2.2)
Albumin: 4.2 g/dL (ref 3.8–4.9)
Alkaline Phosphatase: 68 IU/L (ref 44–121)
BUN/Creatinine Ratio: 10 (ref 9–23)
BUN: 7 mg/dL (ref 6–24)
Bilirubin Total: 0.4 mg/dL (ref 0.0–1.2)
CO2: 22 mmol/L (ref 20–29)
Calcium: 9.3 mg/dL (ref 8.7–10.2)
Chloride: 101 mmol/L (ref 96–106)
Creatinine, Ser: 0.71 mg/dL (ref 0.57–1.00)
GFR calc Af Amer: 114 mL/min/{1.73_m2} (ref >59)
GFR calc non Af Amer: 99 mL/min/{1.73_m2} (ref >59)
Globulin, Total: 3.1 g/dL (ref 1.5–4.5)
Glucose: 84 mg/dL (ref 65–99)
Potassium: 4 mmol/L (ref 3.5–5.2)
Sodium: 139 mmol/L (ref 134–144)
Total Protein: 7.3 g/dL (ref 6.0–8.5)

## 2020-12-20 LAB — CBC
Hematocrit: 37.9 % (ref 34.0–46.6)
Hemoglobin: 12.3 g/dL (ref 11.1–15.9)
MCH: 26.5 pg — ABNORMAL LOW (ref 26.6–33.0)
MCHC: 32.5 g/dL (ref 31.5–35.7)
MCV: 82 fL (ref 79–97)
Platelets: 273 10*3/uL (ref 150–450)
RBC: 4.64 x10E6/uL (ref 3.77–5.28)
RDW: 13.1 % (ref 11.7–15.4)
WBC: 6.6 10*3/uL (ref 3.4–10.8)

## 2020-12-20 LAB — HEPATITIS C ANTIBODY: Hep C Virus Ab: <0.1 s/co ratio (ref 0.0–0.9)

## 2020-12-24 ENCOUNTER — Other Ambulatory Visit: Payer: Self-pay

## 2020-12-24 ENCOUNTER — Telehealth: Payer: Self-pay

## 2020-12-24 DIAGNOSIS — Z20822 Contact with and (suspected) exposure to covid-19: Secondary | ICD-10-CM

## 2020-12-24 NOTE — Telephone Encounter (Signed)
The pt was notified to take  oscillococcinum vit c 500-1000mg  daily along with her vit d and zicam to help with covid

## 2020-12-24 NOTE — Telephone Encounter (Signed)
The pt called and said that her son and husband tested positive for the coronavirus on yesterday, that she wanted to let the office know because she was in last week for an appt, she doesn't feel bad, she is going to get tested and will let the office know her results.  The pt was told that Dr. Baird Cancer isn't in the office this week but that I would get the message to her.

## 2020-12-25 ENCOUNTER — Telehealth: Payer: Self-pay

## 2020-12-25 NOTE — Telephone Encounter (Signed)
I returned the pt's call and notified her that her coronavirus results aren't back yet.

## 2020-12-26 ENCOUNTER — Telehealth: Payer: Self-pay

## 2020-12-26 LAB — SARS-COV-2, NAA 2 DAY TAT

## 2020-12-26 LAB — NOVEL CORONAVIRUS, NAA: SARS-CoV-2, NAA: NOT DETECTED

## 2020-12-26 NOTE — Telephone Encounter (Signed)
The patient was notified  that her covid results were negative and that the pt needed to schedule an appt to be retested at one of the testing sites.

## 2020-12-26 NOTE — Telephone Encounter (Signed)
I called the pt to let her know that her covid results were negative and that the pt needed to schedule an appt to be retested at one of the testing sites.

## 2021-01-21 ENCOUNTER — Other Ambulatory Visit: Payer: Self-pay | Admitting: Internal Medicine

## 2021-01-21 DIAGNOSIS — R2232 Localized swelling, mass and lump, left upper limb: Secondary | ICD-10-CM

## 2021-01-25 ENCOUNTER — Ambulatory Visit
Admission: RE | Admit: 2021-01-25 | Discharge: 2021-01-25 | Disposition: A | Discharge status: Home / Self Care | Payer: BC Managed Care – PPO | Source: Ambulatory Visit | Attending: Internal Medicine | Admitting: Internal Medicine

## 2021-01-25 ENCOUNTER — Other Ambulatory Visit: Payer: Self-pay

## 2021-01-25 DIAGNOSIS — R2232 Localized swelling, mass and lump, left upper limb: Secondary | ICD-10-CM

## 2021-03-04 ENCOUNTER — Other Ambulatory Visit: Payer: BC Managed Care – PPO

## 2021-07-08 ENCOUNTER — Other Ambulatory Visit: Payer: Self-pay

## 2021-07-08 MED ORDER — ELETRIPTAN HYDROBROMIDE 20 MG PO TABS: ORAL_TABLET | ORAL | 2 refills | Status: AC

## 2021-12-16 ENCOUNTER — Other Ambulatory Visit: Payer: Self-pay | Admitting: Internal Medicine

## 2021-12-16 MED ORDER — ALBUTEROL SULFATE (2.5 MG/3ML) 0.083% IN NEBU: 2.5000 mg | INHALATION_SOLUTION | Freq: Four times a day (QID) | RESPIRATORY_TRACT | 12 refills | Status: AC | PRN

## 2021-12-16 MED ORDER — ALBUTEROL SULFATE HFA 108 (90 BASE) MCG/ACT IN AERS: 2.0000 | INHALATION_SPRAY | Freq: Four times a day (QID) | RESPIRATORY_TRACT | 11 refills | Status: DC | PRN

## 2021-12-16 MED ORDER — ALBUTEROL SULFATE HFA 108 (90 BASE) MCG/ACT IN AERS: 2.0000 | INHALATION_SPRAY | Freq: Four times a day (QID) | RESPIRATORY_TRACT | 11 refills | Status: AC | PRN

## 2021-12-17 ENCOUNTER — Ambulatory Visit: Payer: BC Managed Care – PPO | Admitting: Internal Medicine

## 2022-01-08 ENCOUNTER — Encounter: Payer: BC Managed Care – PPO | Admitting: Internal Medicine

## 2022-03-10 ENCOUNTER — Encounter: Payer: Self-pay | Admitting: Internal Medicine

## 2022-03-10 NOTE — Progress Notes (Deleted)
?Rich Brave Llittleton,acting as a Education administrator for Maximino Greenland, MD.,have documented all relevant documentation on the behalf of Maximino Greenland, MD,as directed by  Maximino Greenland, MD while in the presence of Maximino Greenland, MD.  ?This visit occurred during the SARS-CoV-2 public health emergency.  Safety protocols were in place, including screening questions prior to the visit, additional usage of staff PPE, and extensive cleaning of exam room while observing appropriate contact time as indicated for disinfecting solutions. ? ?Subjective:  ?  ? Patient ID: [name] , female    DOB: June 25, 1966 , 56 y.o.   MRN: [id number] ? ? ?Chief Complaint  ?Patient presents with  ? Annual Exam  ? ? ?HPI ? ?She is here today for a full physical examination. She is followed by Dr. Charlesetta Garibaldi for her Gyn exams.   ?  ? ?Past Medical History:  ?Diagnosis Date  ? Ankle fracture   ? left  ? Asthma   ? Migraine   ? Spinal headache   ?  ? ?Family History  ?Problem Relation Age of Onset  ? Gout Mother   ? Diabetes Mother   ? Hypertension Mother   ? Breast cancer Mother 10  ? Asthma Mother   ? Heart Problems Mother   ? Diabetes Father   ? Breast cancer Maternal Aunt   ?     in 28's  ? Breast cancer Maternal Aunt   ?     in 67's  ? ? ? ?Current Outpatient Medications:  ?  albuterol (PROVENTIL) (2.5 MG/3ML) 0.083% nebulizer solution, Take 3 mLs (2.5 mg total) by nebulization every 6 (six) hours as needed for wheezing or shortness of breath., Disp: 75 mL, Rfl: 12 ?  albuterol (VENTOLIN HFA) 108 (90 Base) MCG/ACT inhaler, Inhale 2 puffs into the lungs every 6 (six) hours as needed for wheezing or shortness of breath., Disp: 18 g, Rfl: 11 ?  Cholecalciferol (VITAMIN D PO), Take by mouth. 300 mg daily (Patient not taking: No sig reported), Disp: , Rfl:  ?  eletriptan (RELPAX) 20 MG tablet, TAKE 1 TABLET BY MOUTH EVERY DAY AS NEEDED FOR MIGRAINE OR HEADACE, Disp: 10 tablet, Rfl: 2 ?  estradiol (VIVELLE-DOT) 0.1 MG/24HR patch, 1 patch 2 (two) times  a week. (Patient not taking: No sig reported), Disp: , Rfl:  ?  fluticasone (FLONASE) 50 MCG/ACT nasal spray, PLACE 1 SPRAY INTO BOTH NOSTRILS DAILY. (Patient not taking: Reported on 12/19/2020), Disp: 48 mL, Rfl: 1 ?  levocetirizine (XYZAL) 5 MG tablet, TAKE 1 TABLET BY MOUTH EVERY DAY IN THE EVENING, Disp: 90 tablet, Rfl: 1 ?  Melatonin 2.5 MG CHEW, Chew 1 tablet by mouth at bedtime as needed (sleep). (Patient not taking: Reported on 12/19/2020), Disp: , Rfl:   ? ?Allergies  ?Allergen Reactions  ? Ciprofloxacin Nausea Only  ?  lightheaded  ?  ? ? ?The patient states she uses {contraceptive methods:5051} for birth control. Last LMP was Patient's last menstrual period was 11/21/2016 (exact date).. {Dysmenorrhea-menorrhagia:21918}. Negative for: breast discharge, breast lump(s), breast pain and breast self exam. Associated symptoms include abnormal vaginal bleeding. Pertinent negatives include abnormal bleeding (hematology), anxiety, decreased libido, depression, difficulty falling sleep, dyspareunia, history of infertility, nocturia, sexual dysfunction, sleep disturbances, urinary incontinence, urinary urgency, vaginal discharge and vaginal itching. Diet regular.The patient states her exercise level is   ? Marland Kitchen The patient's tobacco use is:  ?Social History  ? ?Tobacco Use  ?Smoking Status Never  ?Smokeless Tobacco Never  ?Marland Kitchen  She has been exposed to passive smoke. The patient's alcohol use is:  ?Social History  ? ?Substance and Sexual Activity  ?Alcohol Use Yes  ? Comment: occ  ?Marland Kitchen Additional information: Last pap ***, next one scheduled for ***.   ? ?Review of Systems  ?Constitutional: Negative.   ?HENT: Negative.    ?Eyes: Negative.   ?Respiratory: Negative.    ?Cardiovascular: Negative.   ?Gastrointestinal: Negative.   ?Endocrine: Negative.   ?Genitourinary: Negative.   ?Musculoskeletal: Negative.   ?Skin: Negative.   ?Allergic/Immunologic: Negative.   ?Neurological: Negative.   ?Hematological: Negative.    ?Psychiatric/Behavioral: Negative.     ? ?There were no vitals filed for this visit. ?There is no height or weight on file to calculate BMI.  ? ?Objective:  ?Physical Exam  ? ?   ?Assessment And Plan:  ?   ?1. Encounter for general adult medical examination w/o abnormal findings ? ? ? ? ?Patient was given opportunity to ask questions. Patient verbalized understanding of the plan and was able to repeat key elements of the plan. All questions were answered to their satisfaction.  ? ?Gwenevere Abbot, CMA  ? ?I, Sheppard Evens Llittleton, CMA, have reviewed all documentation for this visit. The documentation on 03/10/22 for the exam, diagnosis, procedures, and orders are all accurate and complete.  ?THE PATIENT IS ENCOURAGED TO PRACTICE SOCIAL DISTANCING DUE TO THE COVID-19 PANDEMIC.   ?

## 2022-03-17 NOTE — Progress Notes (Signed)
No show

## 2022-04-07 ENCOUNTER — Encounter: Payer: Self-pay | Admitting: Nurse Practitioner

## 2022-04-07 ENCOUNTER — Other Ambulatory Visit: Payer: Self-pay | Admitting: Internal Medicine

## 2022-04-07 DIAGNOSIS — Z1231 Encounter for screening mammogram for malignant neoplasm of breast: Secondary | ICD-10-CM

## 2022-04-14 ENCOUNTER — Ambulatory Visit
Admission: RE | Admit: 2022-04-14 | Discharge: 2022-04-14 | Disposition: A | Discharge status: Home / Self Care | Payer: BC Managed Care – PPO | Source: Ambulatory Visit | Attending: Internal Medicine | Admitting: Internal Medicine

## 2022-04-14 DIAGNOSIS — Z1231 Encounter for screening mammogram for malignant neoplasm of breast: Secondary | ICD-10-CM

## 2023-05-12 ENCOUNTER — Other Ambulatory Visit: Payer: Self-pay | Admitting: Internal Medicine

## 2023-05-12 DIAGNOSIS — Z1231 Encounter for screening mammogram for malignant neoplasm of breast: Secondary | ICD-10-CM

## 2023-05-22 ENCOUNTER — Ambulatory Visit
Admission: RE | Admit: 2023-05-22 | Discharge: 2023-05-22 | Disposition: A | Discharge status: Home / Self Care | Payer: BC Managed Care – PPO | Source: Ambulatory Visit | Attending: Internal Medicine | Admitting: Internal Medicine

## 2023-05-22 DIAGNOSIS — Z1231 Encounter for screening mammogram for malignant neoplasm of breast: Secondary | ICD-10-CM

## 2024-05-12 ENCOUNTER — Other Ambulatory Visit: Payer: Self-pay | Admitting: Family Medicine

## 2024-05-12 DIAGNOSIS — Z1231 Encounter for screening mammogram for malignant neoplasm of breast: Secondary | ICD-10-CM

## 2024-05-24 ENCOUNTER — Ambulatory Visit
Admission: RE | Admit: 2024-05-24 | Discharge: 2024-05-24 | Disposition: A | Discharge status: Home / Self Care | Source: Ambulatory Visit | Attending: Family Medicine | Admitting: Family Medicine

## 2024-05-24 DIAGNOSIS — Z1231 Encounter for screening mammogram for malignant neoplasm of breast: Secondary | ICD-10-CM

## 2024-07-22 ENCOUNTER — Other Ambulatory Visit: Payer: Self-pay | Admitting: Family Medicine

## 2024-07-22 ENCOUNTER — Ambulatory Visit
Admission: RE | Admit: 2024-07-22 | Discharge: 2024-07-22 | Disposition: A | Discharge status: Home / Self Care | Source: Ambulatory Visit | Attending: Family Medicine | Admitting: Family Medicine

## 2024-07-22 DIAGNOSIS — R220 Localized swelling, mass and lump, head: Secondary | ICD-10-CM
